# Patient Record
Sex: Female | Born: 1970 | Race: Asian | Hispanic: No | Marital: Single | Smoking: Never smoker
Health system: Southern US, Community
[De-identification: ages and names within clinical notes are randomized; demographics above are authoritative.]

## PROBLEM LIST (undated history)

## (undated) DIAGNOSIS — K219 Gastro-esophageal reflux disease without esophagitis: Secondary | ICD-10-CM

## (undated) DIAGNOSIS — R0989 Other specified symptoms and signs involving the circulatory and respiratory systems: Secondary | ICD-10-CM

## (undated) DIAGNOSIS — E049 Nontoxic goiter, unspecified: Secondary | ICD-10-CM

## (undated) DIAGNOSIS — G43909 Migraine, unspecified, not intractable, without status migrainosus: Secondary | ICD-10-CM

## (undated) DIAGNOSIS — E079 Disorder of thyroid, unspecified: Secondary | ICD-10-CM

## (undated) DIAGNOSIS — E039 Hypothyroidism, unspecified: Secondary | ICD-10-CM

## (undated) DIAGNOSIS — B019 Varicella without complication: Secondary | ICD-10-CM

## (undated) DIAGNOSIS — I8390 Asymptomatic varicose veins of unspecified lower extremity: Secondary | ICD-10-CM

## (undated) DIAGNOSIS — D649 Anemia, unspecified: Secondary | ICD-10-CM

## (undated) DIAGNOSIS — N6019 Diffuse cystic mastopathy of unspecified breast: Secondary | ICD-10-CM

## (undated) HISTORY — DX: Asymptomatic varicose veins of unspecified lower extremity: I83.90

## (undated) HISTORY — DX: Varicella without complication: B01.9

## (undated) HISTORY — DX: Disorder of thyroid, unspecified: E07.9

## (undated) HISTORY — DX: Other specified symptoms and signs involving the circulatory and respiratory systems: R09.89

## (undated) HISTORY — DX: Migraine, unspecified, not intractable, without status migrainosus: G43.909

## (undated) HISTORY — DX: Nontoxic goiter, unspecified: E04.9

## (undated) HISTORY — DX: Diffuse cystic mastopathy of unspecified breast: N60.19

---

## 1999-10-03 ENCOUNTER — Other Ambulatory Visit: Admission: RE | Admit: 1999-10-03 | Discharge: 1999-10-03 | Payer: Self-pay | Admitting: Obstetrics and Gynecology

## 2001-01-05 ENCOUNTER — Other Ambulatory Visit: Admission: RE | Admit: 2001-01-05 | Discharge: 2001-01-05 | Payer: Self-pay | Admitting: Obstetrics and Gynecology

## 2002-09-12 ENCOUNTER — Other Ambulatory Visit: Admission: RE | Admit: 2002-09-12 | Discharge: 2002-09-12 | Payer: Self-pay | Admitting: Obstetrics and Gynecology

## 2002-12-30 ENCOUNTER — Ambulatory Visit (HOSPITAL_COMMUNITY): Admission: RE | Admit: 2002-12-30 | Discharge: 2002-12-30 | Payer: Self-pay | Admitting: Family Medicine

## 2002-12-31 ENCOUNTER — Encounter (INDEPENDENT_AMBULATORY_CARE_PROVIDER_SITE_OTHER): Payer: Self-pay | Admitting: Specialist

## 2002-12-31 ENCOUNTER — Inpatient Hospital Stay (HOSPITAL_COMMUNITY): Admission: EM | Admit: 2002-12-31 | Discharge: 2003-01-01 | Payer: Self-pay | Admitting: Emergency Medicine

## 2003-01-01 HISTORY — PX: APPENDECTOMY: SHX54

## 2003-06-14 ENCOUNTER — Other Ambulatory Visit: Admission: RE | Admit: 2003-06-14 | Discharge: 2003-06-14 | Payer: Self-pay | Admitting: Obstetrics and Gynecology

## 2003-10-03 ENCOUNTER — Inpatient Hospital Stay (HOSPITAL_COMMUNITY): Admission: AD | Admit: 2003-10-03 | Discharge: 2003-10-03 | Payer: Self-pay | Admitting: Obstetrics and Gynecology

## 2003-12-04 ENCOUNTER — Ambulatory Visit: Payer: Self-pay | Admitting: Internal Medicine

## 2004-01-03 ENCOUNTER — Inpatient Hospital Stay (HOSPITAL_COMMUNITY): Admission: AD | Admit: 2004-01-03 | Discharge: 2004-01-03 | Payer: Self-pay | Admitting: Obstetrics and Gynecology

## 2004-01-04 ENCOUNTER — Encounter (INDEPENDENT_AMBULATORY_CARE_PROVIDER_SITE_OTHER): Payer: Self-pay | Admitting: Specialist

## 2004-01-04 ENCOUNTER — Inpatient Hospital Stay (HOSPITAL_COMMUNITY): Admission: AD | Admit: 2004-01-04 | Discharge: 2004-01-08 | Payer: Self-pay | Admitting: Obstetrics and Gynecology

## 2004-01-07 ENCOUNTER — Encounter: Payer: Self-pay | Admitting: Cardiology

## 2004-01-07 ENCOUNTER — Encounter: Payer: Self-pay | Admitting: Obstetrics and Gynecology

## 2004-01-09 ENCOUNTER — Inpatient Hospital Stay (HOSPITAL_COMMUNITY): Admission: AD | Admit: 2004-01-09 | Discharge: 2004-01-09 | Payer: Self-pay | Admitting: Obstetrics and Gynecology

## 2004-01-16 ENCOUNTER — Ambulatory Visit: Payer: Self-pay | Admitting: Internal Medicine

## 2004-02-29 ENCOUNTER — Ambulatory Visit: Payer: Self-pay | Admitting: Internal Medicine

## 2004-11-14 ENCOUNTER — Encounter: Admission: RE | Admit: 2004-11-14 | Discharge: 2004-11-14 | Payer: Self-pay | Admitting: Family Medicine

## 2005-03-09 ENCOUNTER — Encounter: Admission: RE | Admit: 2005-03-09 | Discharge: 2005-03-09 | Payer: Self-pay | Admitting: Endocrinology

## 2005-03-25 ENCOUNTER — Other Ambulatory Visit: Admission: RE | Admit: 2005-03-25 | Discharge: 2005-03-25 | Payer: Self-pay | Admitting: Obstetrics and Gynecology

## 2005-08-05 ENCOUNTER — Encounter: Admission: RE | Admit: 2005-08-05 | Discharge: 2005-08-05 | Payer: Self-pay | Admitting: Endocrinology

## 2005-11-03 ENCOUNTER — Encounter: Admission: RE | Admit: 2005-11-03 | Discharge: 2005-11-03 | Payer: Self-pay | Admitting: Otolaryngology

## 2006-09-01 ENCOUNTER — Encounter: Admission: RE | Admit: 2006-09-01 | Discharge: 2006-09-01 | Payer: Self-pay | Admitting: Endocrinology

## 2006-09-29 ENCOUNTER — Ambulatory Visit (HOSPITAL_COMMUNITY): Admission: RE | Admit: 2006-09-29 | Discharge: 2006-09-29 | Payer: Self-pay | Admitting: Gastroenterology

## 2006-09-29 ENCOUNTER — Encounter (INDEPENDENT_AMBULATORY_CARE_PROVIDER_SITE_OTHER): Payer: Self-pay | Admitting: Gastroenterology

## 2007-03-15 ENCOUNTER — Encounter: Admission: RE | Admit: 2007-03-15 | Discharge: 2007-03-15 | Payer: Self-pay | Admitting: Endocrinology

## 2007-10-26 ENCOUNTER — Encounter: Admission: RE | Admit: 2007-10-26 | Discharge: 2007-10-26 | Payer: Self-pay | Admitting: Endocrinology

## 2009-05-29 DIAGNOSIS — N6019 Diffuse cystic mastopathy of unspecified breast: Secondary | ICD-10-CM

## 2009-05-29 HISTORY — DX: Diffuse cystic mastopathy of unspecified breast: N60.19

## 2010-03-23 ENCOUNTER — Encounter: Payer: Self-pay | Admitting: Otolaryngology

## 2010-07-15 NOTE — Op Note (Signed)
NAME:  [name], [name]     ACCOUNT NO.:  [id number]   MEDICAL RECORD NO.:  [id number]          PATIENT TYPE:  AMB   LOCATION:  ENDO                         FACILITY:  [facility]   PHYSICIAN:  Anselmo Rod, M.D.  DATE OF BIRTH:  1970/03/13   DATE OF PROCEDURE:  09/29/2006  DATE OF DISCHARGE:                               OPERATIVE REPORT   PROCEDURE PERFORMED:  Esophagogastroduodenoscopy with multiple cold  biopsies.   ENDOSCOPIST:  Anselmo Rod, M.D.   INSTRUMENT USED:  Pentax video panendoscope.   INDICATIONS FOR PROCEDURE:  40 year old [place] female with a history  epigastric pain and reflux, rule out peptic ulcer disease, esophagitis,  gastritis, etc.   PREPROCEDURE PREPARATION:  Informed consent was procured from the  patient.  The patient fasted for 8 hours prior to the procedure.  The  risks and benefits of the procedure were discussed with the patient in  detail.   PREPROCEDURE PHYSICAL:  The patient had stable vital signs.  Neck  supple.  Lungs clear to auscultation.  S1 and S2 regular.  Abdomen soft  with normal bowel sounds.   DESCRIPTION OF PROCEDURE:  The patient was placed in the left lateral  decubitus position and sedated with 75 mcg of Fentanyl and 6 mg of  Versed given intravenously in slow incremental doses.  Once the patient  was adequately sedated and maintained on low flow oxygen and continuous  cardiac monitoring, the Pentax video panendoscope was advanced through  the mouthpiece over the tongue into esophagus under direct vision.  The  entire esophagus was widely patent, there was no evidence of ring,  stricture, masses, esophagitis or Barrett's mucosa.  The scope was then  advanced into the stomach.  Mild diffuse gastritis was noted.  No  erosions, ulcers, masses or polyps were identified.  Retroflexion in the  high cardia revealed no evidence of a hiatal hernia.  The proximal small  bowel appeared normal.  There was no obvious obstruction.   The patient  tolerated the procedure well without immediate complications.   IMPRESSION:  1. Normal EGD.  2. Healthy appearing GE junction.  3. Mild diffuse gastritis, biopsies done for H. pylori (cold biopsies      x4).  4. Normal proximal small bowel.   RECOMMENDATIONS:  1. Continue PPI.  2. Avoid nonsteroidals.  3. Treat with antibiotics if H. pylori is present on biopsies.  4. Outpatient follow up as need arises in the future.      Anselmo Rod, M.D.  Electronically Signed     JNM/MEDQ  D:  09/29/2006  T:  09/29/2006  Job:  [id number]   cc:   Charlesetta Shanks

## 2010-07-18 NOTE — Op Note (Signed)
NAMEALEXIE, [name] NO.:  [id number]   MEDICAL RECORD NO.:  [id number]          PATIENT TYPE:  INP   LOCATION:  9101                          FACILITY:  [facility]   PHYSICIAN:  Malachi Pro. Ambrose Mantle, M.D. DATE OF BIRTH:  12/09/1970   DATE OF PROCEDURE:  01/04/2004  DATE OF DISCHARGE:                                 OPERATIVE REPORT   PREOPERATIVE DIAGNOSES:  1.  Intrauterine pregnancy at 39 plus weeks.  2.  Failure to progress.  3.  Probable chorioamnionitis.   POSTOPERATIVE DIAGNOSES:  1.  Intrauterine pregnancy at 39 plus weeks.  2.  Failure to progress.  3.  Probable chorioamnionitis.   OPERATION:  Low transverse cervical cesarean section.   SURGEON:  Malachi Pro. Ambrose Mantle, M.D.   ANESTHESIA:  Epidural.   DESCRIPTION OF PROCEDURE:  The patient was brought to the operating room and  the epidural anesthetic was boosted, fetal heart tones were confirmed to  still have a tachycardia of 170 to 185. A Foley catheter was indwelling, the  abdomen was prepped with Betadine solution and draped as a sterile field,  anesthesia was confirmed by pinching the lower abdomen with an Allis clamp  and a low transverse incision was made suprapubically, carried in layers to  the skin, subcutaneous tissue and fascia. The fascia was separated from the  rectus muscle superiorly and inferiorly and the rectus muscle was split in  the midline. The peritoneum was opened vertically. The lower uterine segment  was exposed, an incision was made into the lower uterine segment, it was  extended transversely and then this was pulled away to pull the bladder  away. An incision was then made into the lower uterine segment initially  with a knife and then with my finger. Clear fluid was obtained. It had been  thought to be slightly meconium stained in the labor room initially but I  saw no evidence of meconium. The incision was enlarged by pulling superiorly  and inferiorly on the incision.  The  vertex was in a posterior position, it  was rotated anteriorly and then delivered easily through the incisional  opening, nose and pharynx was suctioned with the bulb. The remainder of the  baby was delivered, the cord was clamped and the infant was given to Angelita Ingles, M.D. who was in attendance.  He assigned the Apgar's for the baby  which will be recorded in the delivery noted but the baby breathed and cried  immediately on the abdominal wall of the mother.  The placenta was removed  after routine cord blood studies were obtained and a segment of cord was  obtained in case a pH was necessary.  The inside of the uterus was inspected  and found to be free of debris. The uterine incision was then closed in two  layers using a running locked suture of #0 Vicryl in the first layer,  nonlocking suture of the same material on the second layer. Liberal  irrigation confirmed hemostasis. The uterus, tubes and ovaries appeared  normal.  Hemostasis was adequate, the abdominal wall was closed in layers  using  interrupted #0 Vicryl in the rectus muscle including some of the  peritoneum with the rectus muscle.  Two running sutures of #0 Vicryl on the  fascia, a running 3-0 Vicryl on the subcu tissue and staples on the  skin. The patient seemed to tolerate the procedure well. Blood loss was  estimated at 100 mL.  Sponge and needle counts were correct and the patient  was returned to recovery in satisfactory condition. The patient had been  given Unasyn at approximately 5 p.m., it was given again after the cord was  clamped and will be given at a q.6 h. basis.      TFH/MEDQ  D:  01/04/2004  T:  01/06/2004  Job:  [id number]

## 2010-07-18 NOTE — Discharge Summary (Signed)
[name], [name] NO.:  [id number]   MEDICAL RECORD NO.:  [id number]          PATIENT TYPE:  OUT   LOCATION:  XRAY                         [facility]ACILITY:  [facility]   PHYSICIAN:  Malachi Pro. Ambrose Mantle, M.D. DATE OF BIRTH:  1970-04-06   DATE OF ADMISSION:  01/07/2004  DATE OF DISCHARGE:  01/07/2004                                 DISCHARGE SUMMARY   HISTORY OF PRESENT ILLNESS:  This is a 40 year old female, para 0, gravida  1, EDC November 8, with group and type O positive, negative antibody,  nonreactive serology, rubella immune, hepatitis B surface antigen negative,  HIV negative, GC/Chlamydia negative, triple screen increased Downs Syndrome  risk to 1:100, blood Glucola test 108, Group B strep negative.  The  patient's course was that she was admitted with possible labor.  She had  failure to progress beyond 5-6 cm and underwent a low transverse cervical C-  section on January 04, 2004, with delivery of a living female infant 7 pounds  14 ounces with Apgars of 8 at 1 and 9 at 5 minutes.  On postpartum day #1,  the patient was afebrile, doing well.  Her hemoglobin dropped from 12.6 to  9.0.  The patient was treated with Unasyn and her fever, which had risen to  102.9 degrees during labor, came down and never went back up.  On postop day  #3, at approximately 2 a.m., the patient awoke short of breath.  O2  saturation was 84-86.  She was placed on oxygen, and the patient rested  well.  AT approximately 3 a.m., she awoke with shortness of breath and  wanted a breathing treatment.  I was called, I saw the patient immediately,  and on 3 liters of O2, the patient's blood gases showed a PO2 of 74.  Chest  x-ray showed no overt heart failure, possible left pleural effusion.  Her  weight was 153 pounds which was higher than it had been in her last prenatal  visit.  She was slightly short of breath.  Lungs were clear except slight  rales at the right base.  She did have 3+ pretibial  edema.  My diagnosis was  acute respiratory embarrassment.  I gave Lasix and asked for pulmonary  consult.  He advised a spiral CT scan of the chest to rule out pulmonary  embolus.  It was done at [facility] and felt that she had  interstitial edema.  An echocardiogram was done to rule out cardiomyopathy.  The result was at the lower limit of normal with an ejection fraction of 50-  55%.  Did show mild to moderate mitral regurgitation.  Dr. Sherene Sires saw the  patient, and felt like she could be managed without a Cardiology consult.  On postop day #4, the patient felt better with Lasix.  She had diuresed 11-  1/2 pounds.  He felt that she could be discharged.  The patient's blood  pressures were in the 140-154/80-94 range.  She did have elevation of the  SGOT and PT.  A uric acid was 7.9, and LDH was 238.  I spoke to Dr.  Senaida Ores who felt like the patient could be discharged.  Her staples were  removed.  Strips were applied.  I gave her thorough instructions on looking  for signs and symptoms of preeclampsia including increasing headache,  epigastric pain, blood pressure above 150/100 and any weight gain of more  than 2 pounds in one day.  The patient was also advised to return to [facility] on  January 09, 2004 for weight check and repeat PIH panel.  Blood gases showed  a pH of 7.4, PCO2 34,  PO2 74, base deficit of 2.5.  Initial hemoglobin  12.6, hematocrit 39, white count 14,500, platelet count 242,000.  Follow up  hemoglobins were 9.0 and 9.0.  Platelets were 183 and 249.  There were 79  segs, 13 lymphs, 5 monos, 3 eosinophils.  Comprehensive metabolic panel  showed SGOT of 100 on November 7 and 103 on November 8.  SGPT of 59 on  November 7 and 97 on November 8.  Total bilirubin was 0.5 and 0.4.  A beta-  type natruretic peptide was pending.  TSH was pending.  Urinalysis done on  January 07, 2004, showed no protein.  RPR was nonreactive.  A portable chest  x-ray showed a low volume film  crowding the pulmonary vascularity.  Probable  left base atelectasis with probable left effusion.  The official results of  the CT scan and echocardiogram are not available, but the written reports  showed left ventricular function at the lower limit of normal.  Ejection  fraction estimated at 50-55%.  Mild to moderate mitral regurgitation.  CT  scan of the chest showed no evidence of acute pulmonary embolus,  interstitial edema and bilateral pleural effusions.   FINAL DIAGNOSES:  1.  Intrauterine pregnancy at 39+ weeks.  2.  Failure to progress in labor.  3.  Presumed relative cephalopelvic disproportion.  4.  Chorioamnionitis.  5.  Pulmonary edema.  6.  Ventricular function at lower limit of normal.   OPERATION:  Low transverse cesarean section.   FINAL CONDITION:  Improved.   DISCHARGE INSTRUCTIONS:  1.  Regular diet.  2.  Regular discharge instruction booklet.  3.  Watch for any signs of increasing headache, increased epigastric pain,      blood pressure above 150/100, weight gain of more than 2 pounds in 24      hours.   FOLLOWUP:  1.  The patient is to return to the maternal admission unit on January 09, 2004, for repeat [facility] panel and another weight.  2.  The patient is to return to see Dr. Sherene Sires in one week.   DISCHARGE MEDICATIONS:  1.  Percocet 5/325 24 tablets one q.4-6h. p.r.n. pain given at discharge.  2.  Diovan 160/25 one daily given at discharge.      TFH/MEDQ  D:  01/08/2004  T:  01/09/2004  Job:  [id number]   cc:   Charlaine Dalton. Sherene Sires, M.D. [facility]

## 2010-07-18 NOTE — Op Note (Signed)
NAMEROSILYN, COACHMAN NO.:  [id number]   MEDICAL RECORD NO.:  [id number]                   PATIENT TYPE:  OUT   LOCATION:  XRAY                                 FACILITY:  [facility]   PHYSICIAN:  Sharlet Salina T. Hoxworth, M.D.          DATE OF BIRTH:  Jan 11, 1971   DATE OF PROCEDURE:  12/31/2002  DATE OF DISCHARGE:                                 OPERATIVE REPORT   PREOPERATIVE DIAGNOSIS:  Acute appendicitis.   POSTOPERATIVE DIAGNOSIS:  Acute appendicitis.   SURGICAL PROCEDURE:  Laparoscopic appendectomy.   SURGEON:  Lorne Skeens. Hoxworth, M.D.   ANESTHESIA:  General.   BRIEF HISTORY:  [name] is a 40 year old female, who presents with  eight hours of periumbilical and right lower quadrant pain.  She has had a  CT scan suggesting acute appendicitis.  She has appropriate tenderness.  Laparoscopic appendectomy has been recommended and accepted.  The nature of  the procedure, indications, risks of bleeding, infection were discussed and  understood preoperatively.  She is now brought to the operating room for  this procedure.   DESCRIPTION OF OPERATION:  The patient brought to the operating room and  placed in supine position on the operating table, and general endotracheal  anesthesia was induced.  She had received broad-spectrum antibiotics.  The  abdomen was widely sterilely prepped and draped and Foley catheter inserted.  Local anesthesia was used to infiltrate the trocar sites.  An open Hasson  technique was used at the umbilicus through a mattress suture of 0 Vicryl.  Under direct vision, a 5 mm trocar was placed in the right upper quadrant  and a 12 mm trocar in the left lower quadrant.  The appendix was located in  the retrocecal position but free and was acutely inflamed without  perforation.  The appendix and cecum were mobilized somewhat, dividing the  lateral peritoneal attachments.  The mesentery of the appendix was then  sequentially  taken with the harmonic scalpel down to the base until the  appendix was completely freed.  The base was free of any inflammation.  It  was divided with the EndoGIA blue load 45 mm stapler, and the appendix was  removed through the Hasson trocar.  The staple line was intact; there was no  bleeding.  The right lower quadrant was irrigated until clear.  Trocar was  removed under direct vision and all CO2 evacuated from the peritoneal  cavity.  The mattress suture was secured at the umbilicus.  The skin  incisions were closed with interrupted subcuticular 4-0 Monocryl and Steri-  Strips.  Sponge, needle, and instrument counts were correct.  Dry sterile  dressings were applied and the patient taken to recovery in good condition.  Lorne Skeens. Hoxworth, M.D.    Tory Emerald  D:  12/31/2002  T:  12/31/2002  Job:  [id number]

## 2010-07-18 NOTE — H&P (Signed)
[name], CASS NO.:  [id number]   MEDICAL RECORD NO.:  [id number]          PATIENT TYPE:  INP   LOCATION:  9101                          FACILITY:  [facility]   PHYSICIAN:  Malachi Pro. Ambrose Mantle, M.D. DATE OF BIRTH:  [birth year]   DATE OF ADMISSION:  01/04/2004  DATE OF DISCHARGE:                                HISTORY & PHYSICAL   HISTORY:  This is a 40 year old, Asian, married female, para 0, gravida 1,  EDC January 08, 2004 by ultrasound admitted in probable labor.  Blood group  and type O positive, negative antibody, nonreactive serology, rubella  immune.  Hepatitis B surface antigen negative, HIV negative, GC and  chlamydia negative. Triple screen increased down syndrome risk to 1 in 100,  patient offered amnio.  One hour Glucola 108, group B strep negative.  Vaginal ultrasound May 31, 2003 crown rump length 1.77 cm, 8 weeks 2 days,  [facility] January 08, 2004. She was begun on levothyroxine for a low normal T4.  Repeat ultrasound August 09, 2003 mean gestational age [redacted] weeks 1 day, all  anatomy was normal. Repeat ultrasound on August 29, 2003 all anatomy normal.  On January 03, 2004, the cervix was closed, 50%, she was seen in the  maternity admission unit during the night of January 04, 2004 and the cervix  did not change in spite of contractions. She came back to maternity  admission in the morning of admission and the nurse felt the cervix was 3  cm, 90%.   PAST MEDICAL HISTORY:  No known allergies.  Illnesses:  Goiter and  migraines.   PAST SURGICAL HISTORY:  In 2004 an appendectomy.   ALCOHOL/TOBACCO/DRUGS:  None.   FAMILY HISTORY:  Maternal aunt with high blood pressure and uterine cancer.   PHYSICAL EXAMINATION:  VITAL SIGNS:  On admission, the patient's vital signs  were normal.  HEART:  Normal size and sounds, no murmurs.  LUNGS:  Clear to P&A.  ABDOMEN:  Fundal height had been 38 cm on January 02, 2004, fetal heart  tones were 140. No decelerations. By   my exam, the cervix was 1-2 cm, 60%  vertex and a -3.   The patient was admitted for augmentation. At 12:21 p.m., I was called to  see the patient because of fetal tachycardia, late decelerations and  increased vaginal bleeding. An epidural had been given at 10:50 a.m.,  decelerations began after that and worsened with insertion of a Foley  catheter with the patient on her back.  At 12:21 p.m., the fetal heart rate  was normal with no decelerations, temperature was 100.7 rectally, blood  pressure was 101/57 compared to 117/70 before the epidural.  The uterus was  soft between contractions, cervix was 3 plus cm, 90% vertex at a -2, 15 mL  of clots were removed from the cervix. There was no evidence of fetal  compromise, fetal  heart rate decelerations were thought to be secondary to  position, epidural and relative hypotension. The Pitocin had been stopped.  By 1:57 p.m., there had been another 15 mL clot, contractions were every 3  minutes,  cervix was unchanged, fetal heart rate was normal, Pitocin was  begun again.  The patient's temperature elevated to 102, Unasyn was begun,  contractions were every 3 minutes on 6 mU of Pitocin. The cervix was 4+ cm  at 5:35 p.m. and I made a decision to watch her for two more hours.  At  7:30, I checked her and she was 6 cm.  Temperature was 102.7.  At 8:30 p.m.,  the nurse felt she was 8 cm but now at 9:30 p.m. I have examined her and the  patient is no more than 5-6  cm. Vertex still at a -1 to -2.  Will proceed with C section for failure to  progress in labor, probable chorioamnionitis, occasional late decelerations  and persistent fetal tachycardia that is thought to be secondary to the  maternal temperature. The patient understands and agrees to proceed.      TFH/MEDQ  D:  01/04/2004  T:  01/06/2004  Job:  [id number]

## 2010-07-18 NOTE — H&P (Signed)
   [name], [name] NO.:  [id number]   MEDICAL RECORD NO.:  [id number]                   PATIENT TYPE:  OUT   LOCATION:  XRAY                                 [facility]AC[facility]   PHYSICIAN:  Sharlet Salina T. Hoxworth, M.D.          DATE OF BIRTH:  September 17, 1970   DATE OF ADMISSION:  12/30/2002  DATE OF DISCHARGE:                                HISTORY & PHYSICAL   CHIEF COMPLAINT:  Right lower quadrant pain.   HISTORY OF PRESENT ILLNESS:  Ms. [name] is a 40 year old female with the  gradual onset of 8 hours of periumbilical and now right lower quadrant pain  which is constant. She has had some slight nausea and no vomiting. No fever  or chills. No history of any similar symptoms. No GU symptoms.   PAST MEDICAL HISTORY:  She has been treated in the past for hyperthyroidism.  Has migraine headaches.   PAST SURGICAL HISTORY:  No surgery or hospitalizations or serious illnesses.   MEDICATIONS:  None.   ALLERGIES:  None.   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  Does not smoke cigarettes or drink alcohol.   FAMILY HISTORY:  Noncontributory.   REVIEW OF SYSTEMS:  GENERAL:  No fever or chills. RESPIRATORY:  No history  of asthma, shortness of breath or wheezing.  CARDIAC:  No palpitations, chest pain. GASTROINTESTINAL:  As above.  GENITOURINARY:  No urinary burning or frequency.   PHYSICAL EXAMINATION:  VITAL SIGNS:  She is afebrile and vital signs all  within normal limits.  GENERAL:  Petite female. No acute distress.  SKIN:  Warm and dry without rash or infection.  HEENT:  Sclera nonicteric. Nares and oropharynx clear.  NECK:  No masses.  LUNGS:  Clear to auscultation.  CARDIAC:  Regular rhythm without murmurs.  ABDOMEN:  Well localized right lower quadrant tenderness with some guarding  but no definite peritoneal signs. No masses.  EXTREMITIES:  No deformities. No swelling.  NEUROLOGIC:  Grossly normal.   LABORATORY DATA:  White count mildly elevated at 11.8 and hemoglobin normal  from Dr. Danella Deis at [facility].   CT scan of the abdomen and pelvis was obtained which shows appendix in a  retrocecal position with evidence of acute inflammation no evidence of  perforation.   ASSESSMENT/PLAN:  Acute appendicitis. The patient will receive preoperative  antibiotics and will be taken urgently to the operating room for  laparoscopic appendectomy.                                               Lorne Skeens. Hoxworth, M.D.    Tory Emerald  D:  12/31/2002  T:  12/31/2002  Job:  [id number]

## 2011-05-11 DIAGNOSIS — M79606 Pain in leg, unspecified: Secondary | ICD-10-CM

## 2011-05-11 DIAGNOSIS — E0590 Thyrotoxicosis, unspecified without thyrotoxic crisis or storm: Secondary | ICD-10-CM | POA: Insufficient documentation

## 2011-05-11 DIAGNOSIS — N6019 Diffuse cystic mastopathy of unspecified breast: Secondary | ICD-10-CM

## 2011-06-08 ENCOUNTER — Ambulatory Visit: Payer: Self-pay | Admitting: Obstetrics and Gynecology

## 2011-06-30 ENCOUNTER — Ambulatory Visit (INDEPENDENT_AMBULATORY_CARE_PROVIDER_SITE_OTHER): Payer: Commercial Managed Care - PPO | Admitting: Obstetrics and Gynecology

## 2011-06-30 ENCOUNTER — Encounter: Payer: Self-pay | Admitting: Obstetrics and Gynecology

## 2011-06-30 VITALS — BP 96/62 | Ht 58.5 in | Wt 110.0 lb

## 2011-06-30 DIAGNOSIS — N390 Urinary tract infection, site not specified: Secondary | ICD-10-CM

## 2011-06-30 DIAGNOSIS — R109 Unspecified abdominal pain: Secondary | ICD-10-CM | POA: Insufficient documentation

## 2011-06-30 DIAGNOSIS — Z124 Encounter for screening for malignant neoplasm of cervix: Secondary | ICD-10-CM

## 2011-06-30 DIAGNOSIS — D649 Anemia, unspecified: Secondary | ICD-10-CM

## 2011-06-30 DIAGNOSIS — E039 Hypothyroidism, unspecified: Secondary | ICD-10-CM | POA: Insufficient documentation

## 2011-06-30 LAB — POCT URINALYSIS DIPSTICK
Spec Grav, UA: 1.015
pH, UA: 5

## 2011-06-30 NOTE — Progress Notes (Signed)
The patient reports:normal menses, no abnormal bleeding, pelvic pain or discharge, no complaints  Contraception:no method  Last mammogram: not applicable Last pap: was normal and not applicable May  2010  GC/Chlamydia cultures offered: declined HIV/RPR/HbsAg offered:  declined HSV 1 and 2 glycoprotein offered: declined  Menstrual cycle regular and monthly: Yes Menstrual flow normal: Yes  Urinary symptoms: none Normal bowel movements: No: constipated Reports abuse at home: No:   Subjective:    [name] is a 41 y.o. female, No obstetric history on file., who presents for an annual exam. See above.she was told that her hemoglobin was 8.  She has a history of GI distress.  Prior Hysterectomy: No    History   Social History  . Marital Status: Married    Spouse Name: N/A    Number of Children: N/A  . Years of Education: N/A   Social History Main Topics  . Smoking status: Never Smoker   . Smokeless tobacco: Never Used  . Alcohol Use: No  . Drug Use: No  . Sexually Active: Yes   Other Topics Concern  . None   Social History Narrative  . None    Menstrual cycle:   LMP: Patient's last menstrual period was 06/13/2011.           Cycle: Regular, monthly with normal flow and no severe dysmenorrha  The following portions of the patient's history were reviewed and updated as appropriate: allergies, current medications, past family history, past medical history, past social history, past surgical history and problem list.  Review of Systems Pertinent items are noted in HPI. Breast:Negative for breast lump,nipple discharge or nipple retraction Gastrointestinal: Negative for abdominal pain, change in bowel habits or rectal bleeding Urinary:negative   Objective:    BP 96/62  Ht 4' 10.5" (1.486 m)  Wt 110 lb (49.896 kg)  BMI 22.60 kg/m2  LMP 06/13/2011    Weight:  Wt Readings from Last 1 Encounters:  06/30/11 110 lb (49.896 kg)          BMI: Body mass  index is 22.60 kg/(m^2).  General Appearance: Alert, appropriate appearance for age. No acute distress HEENT: Grossly normal Neck / Thyroid: Supple, no masses, nodes or enlargement Lungs: clear to auscultation bilaterally Back: No CVA tenderness Breast Exam: No masses or nodes.No dimpling, nipple retraction or discharge. Cardiovascular: Regular rate and rhythm. S1, S2, no murmur Gastrointestinal: Soft, non-tender, no masses or organomegaly  ++++++++++++++++++++++++++++++++++++++++++++++++++++++++  Pelvic Exam: External genitalia: normal general appearance Vaginal: normal mucosa without prolapse or lesions Cervix: normal appearance Adnexa: normal bimanual exam Uterus: normal size shape and consistency Rectovaginal: normal rectal, no masses  ++++++++++++++++++++++++++++++++++++++++++++++++++++++++  Lymphatic Exam: Non-palpable nodes in neck, clavicular, axillary, or inguinal regions Neurologic: Normal speech, no tremor  Psychiatric: Alert and oriented, appropriate affect.   Wet Prep:not applicable Urinalysis:not applicable UPT: Not done   Assessment:    Normal gyn exam   Severe anemia of uncertain etiology.  GI distress.  The patient may need an upper GI series  Overweight or obese: No   Pelvic relaxation: No   Plan:    mammogram pap smear Return to office in 2 weeks for ultrasound. Contraception:no method    STD screen request: none RPR: No.  HBsAg: No. Hepatitis C: No.  The updated Pap smear screening guidelines were discussed with the patient. The patient requested that I obtain a Pap smear: Yes.  Kegel exercises discussed: Yes.  Proper diet and regular exercise were reviewed.  Annual mammograms recommended  starting at age 45. Proper breast care was discussed.  Screening colonoscopy is recommended beginning at age 90.  Regular health maintenance was reviewed.  Sleep hygiene was discussed.  Adequate calcium and vitamin D intake was  emphasized.  Mylinda Latina.D.

## 2011-07-02 LAB — PAP IG W/ RFLX HPV ASCU

## 2011-07-10 ENCOUNTER — Encounter: Payer: Self-pay | Admitting: Obstetrics and Gynecology

## 2011-07-10 ENCOUNTER — Other Ambulatory Visit: Payer: Self-pay

## 2011-07-13 ENCOUNTER — Ambulatory Visit (INDEPENDENT_AMBULATORY_CARE_PROVIDER_SITE_OTHER): Payer: Commercial Managed Care - PPO | Admitting: Obstetrics and Gynecology

## 2011-07-13 ENCOUNTER — Ambulatory Visit (INDEPENDENT_AMBULATORY_CARE_PROVIDER_SITE_OTHER): Payer: Commercial Managed Care - PPO

## 2011-07-13 ENCOUNTER — Other Ambulatory Visit: Payer: Self-pay | Admitting: Obstetrics and Gynecology

## 2011-07-13 ENCOUNTER — Encounter: Payer: Self-pay | Admitting: Obstetrics and Gynecology

## 2011-07-13 ENCOUNTER — Other Ambulatory Visit: Payer: Self-pay

## 2011-07-13 DIAGNOSIS — D649 Anemia, unspecified: Secondary | ICD-10-CM

## 2011-07-13 DIAGNOSIS — N926 Irregular menstruation, unspecified: Secondary | ICD-10-CM

## 2011-07-13 NOTE — Progress Notes (Signed)
The patient presents with a history of anemia and abdominal pain.  Her last menstrual cycle was June 13, 2011.  She feels she is known to have her cycle any day now.  An ultrasound shows normal female organs but a thickened endometrial stripe.  For optimal evaluation we will plan to have the patient return in 2 weeks once she has had her period.  We will then perform the high dorsal sonogram and endometrial biopsy if appropriate.  Dr. Stefano Gaul

## 2011-09-01 ENCOUNTER — Telehealth: Payer: Self-pay | Admitting: Obstetrics and Gynecology

## 2011-09-01 NOTE — Telephone Encounter (Signed)
Please sch pt for sonohyst

## 2011-09-01 NOTE — Telephone Encounter (Signed)
TRIAGE/PROC, QUES

## 2013-06-27 ENCOUNTER — Other Ambulatory Visit: Payer: Self-pay

## 2013-06-27 DIAGNOSIS — Z1231 Encounter for screening mammogram for malignant neoplasm of breast: Secondary | ICD-10-CM

## 2013-07-10 ENCOUNTER — Encounter (INDEPENDENT_AMBULATORY_CARE_PROVIDER_SITE_OTHER): Payer: Self-pay

## 2013-07-10 ENCOUNTER — Ambulatory Visit
Admission: RE | Admit: 2013-07-10 | Discharge: 2013-07-10 | Disposition: A | Discharge status: Home / Self Care | Payer: Commercial Managed Care - PPO | Source: Ambulatory Visit

## 2013-07-10 DIAGNOSIS — Z1231 Encounter for screening mammogram for malignant neoplasm of breast: Secondary | ICD-10-CM

## 2013-12-01 ENCOUNTER — Other Ambulatory Visit: Payer: Self-pay | Admitting: Family Medicine

## 2013-12-01 ENCOUNTER — Other Ambulatory Visit (HOSPITAL_COMMUNITY)
Admission: RE | Admit: 2013-12-01 | Discharge: 2013-12-01 | Disposition: A | Discharge status: Home / Self Care | Payer: 59 | Source: Ambulatory Visit | Attending: Family Medicine | Admitting: Family Medicine

## 2013-12-01 DIAGNOSIS — Z124 Encounter for screening for malignant neoplasm of cervix: Secondary | ICD-10-CM | POA: Diagnosis present

## 2013-12-01 DIAGNOSIS — Z1151 Encounter for screening for human papillomavirus (HPV): Secondary | ICD-10-CM | POA: Diagnosis present

## 2013-12-06 LAB — CYTOLOGY - PAP

## 2013-12-28 ENCOUNTER — Other Ambulatory Visit: Payer: Self-pay | Admitting: Obstetrics and Gynecology

## 2014-01-01 ENCOUNTER — Encounter: Payer: Self-pay | Admitting: Obstetrics and Gynecology

## 2014-04-10 ENCOUNTER — Other Ambulatory Visit: Payer: Self-pay | Admitting: Obstetrics and Gynecology

## 2014-05-01 NOTE — H&P (Signed)
  Admission History and Physical Exam for a Gynecology Patient  [name] is a 44 y.o. female, G1P1001, who presents for hysteroscopy, resection of endometrial polyps, and a D&C. She has been followed at the [facility] and Gynecology division of [facility]. The patient has a history of menorrhagia.  An ultrasound showed endometrial polyps.  OB History    Gravida Para Term Preterm AB TAB SAB Ectopic Multiple Living   1 1 1       1       Past Medical History  Diagnosis Date  . Goiter   . Pulmonary congestion     postpartum  . Esophagitis   . Migraines   . Chicken pox   . Measles   . Varicose veins   . Thyroid disease   . Fibrocystic breast changes 05/29/2009    No prescriptions prior to admission    Past Surgical History  Procedure Laterality Date  . Appendectomy  01/01/2003  . Cesarean section  01/04/2004    No Known Allergies  Family History: family history includes Hypertension in her mother.  Social History:  reports that she has never smoked. She has never used smokeless tobacco. She reports that she does not drink alcohol or use illicit drugs.  Review of systems: See HPI.  Admission Physical Exam:    BMT = 23.8.  HEENT:                 Within normal limits Chest:                   Clear Heart:                    Regular rate and rhythm Breasts:                No masses, skin changes, bleeding, or discharge present Abdomen:             Nontender, no masses Extremities:          Grossly normal Neurologic exam: Grossly normal  Pelvic exam:  External genitalia: normal general appearance Vaginal: normal without tenderness, induration or masses Cervix: normal appearance Adnexa: normal size, nontender Uterus: normal size, nontender  Assessment:  Menorrhagia  Endometrial polyps  Plan:  The patient will undergo hysteroscopy, hysteroscopic resection of endometrial polyps, and a D&C.  She understands  the indications for surgical procedure.  She accepts the risk of, but not limited to, anesthetic complications, bleeding, infections, and possible damage to surrounding organs.   Eli Hose 05/01/2014

## 2014-05-02 ENCOUNTER — Encounter (HOSPITAL_COMMUNITY)
Admission: RE | Disposition: A | Discharge status: Home / Self Care | Payer: Self-pay | Source: Ambulatory Visit | Attending: Obstetrics and Gynecology

## 2014-05-02 ENCOUNTER — Encounter (HOSPITAL_COMMUNITY): Payer: Self-pay | Admitting: Certified Registered Nurse Anesthetist

## 2014-05-02 ENCOUNTER — Ambulatory Visit (HOSPITAL_COMMUNITY): Payer: Commercial Managed Care - PPO | Admitting: Certified Registered Nurse Anesthetist

## 2014-05-02 ENCOUNTER — Ambulatory Visit (HOSPITAL_COMMUNITY)
Admission: RE | Admit: 2014-05-02 | Discharge: 2014-05-02 | Disposition: A | Discharge status: Home / Self Care | Payer: Commercial Managed Care - PPO | Source: Ambulatory Visit | Attending: Obstetrics and Gynecology | Admitting: Obstetrics and Gynecology

## 2014-05-02 DIAGNOSIS — N920 Excessive and frequent menstruation with regular cycle: Secondary | ICD-10-CM | POA: Diagnosis present

## 2014-05-02 DIAGNOSIS — E049 Nontoxic goiter, unspecified: Secondary | ICD-10-CM | POA: Insufficient documentation

## 2014-05-02 DIAGNOSIS — N840 Polyp of corpus uteri: Secondary | ICD-10-CM | POA: Diagnosis present

## 2014-05-02 DIAGNOSIS — Z9089 Acquired absence of other organs: Secondary | ICD-10-CM | POA: Diagnosis not present

## 2014-05-02 DIAGNOSIS — D649 Anemia, unspecified: Secondary | ICD-10-CM | POA: Insufficient documentation

## 2014-05-02 DIAGNOSIS — E039 Hypothyroidism, unspecified: Secondary | ICD-10-CM | POA: Insufficient documentation

## 2014-05-02 DIAGNOSIS — Z862 Personal history of diseases of the blood and blood-forming organs and certain disorders involving the immune mechanism: Secondary | ICD-10-CM | POA: Insufficient documentation

## 2014-05-02 DIAGNOSIS — G43909 Migraine, unspecified, not intractable, without status migrainosus: Secondary | ICD-10-CM | POA: Diagnosis not present

## 2014-05-02 DIAGNOSIS — D259 Leiomyoma of uterus, unspecified: Secondary | ICD-10-CM | POA: Diagnosis not present

## 2014-05-02 HISTORY — PX: DILATATION & CURRETTAGE/HYSTEROSCOPY WITH RESECTOCOPE: SHX5572

## 2014-05-02 LAB — URINALYSIS, ROUTINE W REFLEX MICROSCOPIC
Bilirubin Urine: NEGATIVE
Glucose, UA: NEGATIVE mg/dL
KETONES UR: NEGATIVE mg/dL
NITRITE: NEGATIVE
Protein, ur: NEGATIVE mg/dL
Urobilinogen, UA: 0.2 mg/dL (ref 0.0–1.0)
pH: 5.5 (ref 5.0–8.0)

## 2014-05-02 LAB — CBC
HCT: 34.9 % — ABNORMAL LOW (ref 36.0–46.0)
Hemoglobin: 11.5 g/dL — ABNORMAL LOW (ref 12.0–15.0)
MCH: 21.7 pg — ABNORMAL LOW (ref 26.0–34.0)
MCHC: 33 g/dL (ref 30.0–36.0)
MCV: 66 fL — AB (ref 78.0–100.0)
Platelets: 149 10*3/uL — ABNORMAL LOW (ref 150–400)
RBC: 5.29 MIL/uL — ABNORMAL HIGH (ref 3.87–5.11)
RDW: 15.8 % — AB (ref 11.5–15.5)
WBC: 8.1 10*3/uL (ref 4.0–10.5)

## 2014-05-02 LAB — URINE MICROSCOPIC-ADD ON

## 2014-05-02 LAB — PREGNANCY, URINE: PREG TEST UR: NEGATIVE

## 2014-05-02 SURGERY — DILATATION & CURETTAGE/HYSTEROSCOPY WITH RESECTOCOPE
Anesthesia: General

## 2014-05-02 MED ORDER — ONDANSETRON HCL 4 MG/2ML IJ SOLN
INTRAMUSCULAR | Status: AC
  Filled 2014-05-02: qty 2

## 2014-05-02 MED ORDER — LACTATED RINGERS IV SOLN
INTRAVENOUS | Status: DC
  Administered 2014-05-02 (×2): via INTRAVENOUS

## 2014-05-02 MED ORDER — MEPERIDINE HCL 25 MG/ML IJ SOLN: 6.2500 mg | INTRAMUSCULAR | Status: DC | PRN

## 2014-05-02 MED ORDER — PROMETHAZINE HCL 12.5 MG PO TABS: 12.5000 mg | ORAL_TABLET | Freq: Four times a day (QID) | ORAL | Status: DC | PRN

## 2014-05-02 MED ORDER — HYDROCODONE-ACETAMINOPHEN 5-300 MG PO TABS: 1.0000 | ORAL_TABLET | ORAL | Status: DC | PRN

## 2014-05-02 MED ORDER — BUPIVACAINE-EPINEPHRINE 0.5% -1:200000 IJ SOLN
INTRAMUSCULAR | Status: DC | PRN
Start: 2014-05-02 — End: 2014-05-02
  Administered 2014-05-02: 10 mL

## 2014-05-02 MED ORDER — KETOROLAC TROMETHAMINE 30 MG/ML IJ SOLN: 30.0000 mg | Freq: Once | INTRAMUSCULAR | Status: DC | PRN

## 2014-05-02 MED ORDER — KETOROLAC TROMETHAMINE 30 MG/ML IJ SOLN
INTRAMUSCULAR | Status: AC
  Filled 2014-05-02: qty 1

## 2014-05-02 MED ORDER — DEXAMETHASONE SODIUM PHOSPHATE 4 MG/ML IJ SOLN
INTRAMUSCULAR | Status: AC
  Filled 2014-05-02: qty 1

## 2014-05-02 MED ORDER — ONDANSETRON HCL 4 MG/2ML IJ SOLN
INTRAMUSCULAR | Status: DC | PRN
  Administered 2014-05-02: 4 mg via INTRAVENOUS

## 2014-05-02 MED ORDER — KETOROLAC TROMETHAMINE 30 MG/ML IJ SOLN
INTRAMUSCULAR | Status: DC | PRN
  Administered 2014-05-02: 30 mg via INTRAMUSCULAR
  Administered 2014-05-02: 30 mg via INTRAVENOUS

## 2014-05-02 MED ORDER — SCOPOLAMINE 1 MG/3DAYS TD PT72
1.0000 | MEDICATED_PATCH | Freq: Once | TRANSDERMAL | Status: DC
  Administered 2014-05-02: 1.5 mg via TRANSDERMAL

## 2014-05-02 MED ORDER — ACETAMINOPHEN 325 MG PO TABS: 325.0000 mg | ORAL_TABLET | ORAL | Status: DC | PRN

## 2014-05-02 MED ORDER — MIDAZOLAM HCL 2 MG/2ML IJ SOLN
INTRAMUSCULAR | Status: AC
  Filled 2014-05-02: qty 2

## 2014-05-02 MED ORDER — IBUPROFEN 800 MG PO TABS: 800.0000 mg | ORAL_TABLET | Freq: Three times a day (TID) | ORAL | Status: DC | PRN

## 2014-05-02 MED ORDER — FENTANYL CITRATE 0.05 MG/ML IJ SOLN
INTRAMUSCULAR | Status: AC
  Filled 2014-05-02: qty 2

## 2014-05-02 MED ORDER — DEXAMETHASONE SODIUM PHOSPHATE 10 MG/ML IJ SOLN
INTRAMUSCULAR | Status: DC | PRN
  Administered 2014-05-02: 4 mg via INTRAVENOUS

## 2014-05-02 MED ORDER — LIDOCAINE HCL (CARDIAC) 20 MG/ML IV SOLN
INTRAVENOUS | Status: AC
  Filled 2014-05-02: qty 5

## 2014-05-02 MED ORDER — BUPIVACAINE-EPINEPHRINE (PF) 0.5% -1:200000 IJ SOLN
INTRAMUSCULAR | Status: AC
  Filled 2014-05-02: qty 30

## 2014-05-02 MED ORDER — PROPOFOL 10 MG/ML IV BOLUS
INTRAVENOUS | Status: DC | PRN
  Administered 2014-05-02: 150 mg via INTRAVENOUS

## 2014-05-02 MED ORDER — PROMETHAZINE HCL 25 MG/ML IJ SOLN: 6.2500 mg | INTRAMUSCULAR | Status: DC | PRN

## 2014-05-02 MED ORDER — PROPOFOL 10 MG/ML IV BOLUS
INTRAVENOUS | Status: AC
  Filled 2014-05-02: qty 20

## 2014-05-02 MED ORDER — ACETAMINOPHEN 160 MG/5ML PO SOLN: 325.0000 mg | ORAL | Status: DC | PRN

## 2014-05-02 MED ORDER — FENTANYL CITRATE 0.05 MG/ML IJ SOLN: 25.0000 ug | INTRAMUSCULAR | Status: DC | PRN

## 2014-05-02 MED ORDER — LIDOCAINE HCL (CARDIAC) 20 MG/ML IV SOLN
INTRAVENOUS | Status: DC | PRN
  Administered 2014-05-02: 60 mg via INTRAVENOUS

## 2014-05-02 MED ORDER — SCOPOLAMINE 1 MG/3DAYS TD PT72
MEDICATED_PATCH | TRANSDERMAL | Status: AC
  Administered 2014-05-02: 1.5 mg via TRANSDERMAL
  Filled 2014-05-02: qty 1

## 2014-05-02 MED ORDER — FENTANYL CITRATE 0.05 MG/ML IJ SOLN
INTRAMUSCULAR | Status: DC | PRN
Start: 2014-05-02 — End: 2014-05-02
  Administered 2014-05-02 (×3): 25 ug via INTRAVENOUS

## 2014-05-02 MED ORDER — MIDAZOLAM HCL 2 MG/2ML IJ SOLN
INTRAMUSCULAR | Status: DC | PRN
  Administered 2014-05-02: 2 mg via INTRAVENOUS

## 2014-05-02 MED ORDER — MIDAZOLAM HCL 2 MG/2ML IJ SOLN: 0.5000 mg | Freq: Once | INTRAMUSCULAR | Status: DC | PRN

## 2014-05-02 SURGICAL SUPPLY — 19 items
CANISTER SUCT 3000ML (MISCELLANEOUS) ×3 IMPLANT
CATH ROBINSON RED A/P 16FR (CATHETERS) ×3 IMPLANT
CLOTH BEACON ORANGE TIMEOUT ST (SAFETY) ×3 IMPLANT
CONTAINER PREFILL 10% NBF 60ML (FORM) ×6 IMPLANT
ELECT REM PT RETURN 9FT ADLT (ELECTROSURGICAL) ×3
ELECTRODE REM PT RTRN 9FT ADLT (ELECTROSURGICAL) ×2 IMPLANT
GLOVE BIO SURGEON STRL SZ 6.5 (GLOVE) ×6 IMPLANT
GLOVE BIO SURGEON STRL SZ7 (GLOVE) ×3 IMPLANT
GLOVE BIOGEL PI IND STRL 8.5 (GLOVE) ×2 IMPLANT
GLOVE BIOGEL PI INDICATOR 8.5 (GLOVE) ×1
GLOVE ECLIPSE 8.0 STRL XLNG CF (GLOVE) ×6 IMPLANT
GOWN STRL REUS W/TWL LRG LVL3 (GOWN DISPOSABLE) ×6 IMPLANT
LOOP ANGLED CUTTING 22FR (CUTTING LOOP) ×3 IMPLANT
PACK VAGINAL MINOR WOMEN LF (CUSTOM PROCEDURE TRAY) ×3 IMPLANT
PAD OB MATERNITY 4.3X12.25 (PERSONAL CARE ITEMS) ×3 IMPLANT
TOWEL OR 17X24 6PK STRL BLUE (TOWEL DISPOSABLE) ×6 IMPLANT
TUBING AQUILEX INFLOW (TUBING) ×3 IMPLANT
TUBING AQUILEX OUTFLOW (TUBING) ×3 IMPLANT
WATER STERILE IRR 1000ML POUR (IV SOLUTION) ×3 IMPLANT

## 2014-05-02 NOTE — Anesthesia Procedure Notes (Signed)
Procedure Name: LMA Insertion Date/Time: 05/02/2014 8:30 AM Performed by: Raenette Rover Pre-anesthesia Checklist: Patient identified, Patient being monitored, Emergency Drugs available and Suction available Patient Re-evaluated:Patient Re-evaluated prior to inductionOxygen Delivery Method: Circle system utilized Preoxygenation: Pre-oxygenation with 100% oxygen Intubation Type: IV induction Ventilation: Mask ventilation without difficulty LMA: LMA inserted LMA Size: 4.0 Number of attempts: 1 Placement Confirmation: positive ETCO2,  CO2 detector and breath sounds checked- equal and bilateral Tube secured with: Tape Dental Injury: Teeth and Oropharynx as per pre-operative assessment

## 2014-05-02 NOTE — Anesthesia Postprocedure Evaluation (Signed)
  Anesthesia Post Note  Patient: [name]  Procedure(s) Performed: Procedure(s): DILATATION & CURETTAGE/HYSTEROSCOPY WITH RESECTOCOPE  Anesthesia type: GA  Patient location: PACU  Post pain: Pain level controlled  Post assessment: Post-op Vital signs reviewed  Last Vitals:  Filed Vitals:   05/02/14 1015  BP: 109/74  Pulse: 69  Temp:   Resp: 17    Post vital signs: Reviewed  Level of consciousness: sedated  Complications: No apparent anesthesia complications

## 2014-05-02 NOTE — H&P (Signed)
The patient was interviewed and examined today.  The previously documented history and physical examination was reviewed. There are no changes. The operative procedure was reviewed. The risks and benefits were outlined again. The specific risks include, but are not limited to, anesthetic complications, bleeding, infections, and possible damage to the surrounding organs. The patient's questions were answered.  We are ready to proceed as outlined. The likelihood of the patient achieving the goals of this procedure is very likely.   BP 112/73 mmHg  Pulse 95  Temp(Src) 98.1 F (36.7 C) (Oral)  Resp 20  Ht 5' (1.524 m)  Wt 110 lb (49.896 kg)  BMI 21.48 kg/m2  SpO2 100%  Results for orders placed or performed during the hospital encounter of 05/02/14 (from the past 24 hour(s))  Urinalysis, Routine w reflex microscopic     Status: Abnormal   Collection Time: 05/02/14  7:14 AM  Result Value Ref Range   Color, Urine YELLOW YELLOW   APPearance CLEAR CLEAR   Specific Gravity, Urine >1.030 (H) 1.005 - 1.030   pH 5.5 5.0 - 8.0   Glucose, UA NEGATIVE NEGATIVE mg/dL   Hgb urine dipstick MODERATE (A) NEGATIVE   Bilirubin Urine NEGATIVE NEGATIVE   Ketones, ur NEGATIVE NEGATIVE mg/dL   Protein, ur NEGATIVE NEGATIVE mg/dL   Urobilinogen, UA 0.2 0.0 - 1.0 mg/dL   Nitrite NEGATIVE NEGATIVE   Leukocytes, UA LARGE (A) NEGATIVE  Pregnancy, urine     Status: None   Collection Time: 05/02/14  7:14 AM  Result Value Ref Range   Preg Test, Ur NEGATIVE NEGATIVE  Urine microscopic-add on     Status: None   Collection Time: 05/02/14  7:14 AM  Result Value Ref Range   Squamous Epithelial / LPF RARE RARE   WBC, UA 21-50 <3 WBC/hpf   Bacteria, UA RARE RARE   Gildardo Cranker, M.D.

## 2014-05-02 NOTE — Op Note (Signed)
OPERATIVE NOTE  [name]  DOB:    [birth year]  MRN:    [id number]  CSN:    [id number]  Date of Surgery:  05/02/2014  Preoperative Diagnosis:  Menorrhagia Uterine polyps Anemia (hemoglobin 11.5)  Postoperative Diagnosis:  Menorrhagia Uterine polyps Anemia (hemoglobin 11.5)  Procedure:  Hysteroscopy with resection of endometrial polyps Dilatation and curettage  Surgeon:  Gildardo Cranker, M.D.  Assistant:  None  Anesthetic:  General  Disposition:  The patient is a 44 y.o.-year-old female who presents with menorrhagia, uterine polyps, and anemia. She understands the indications for her surgical procedure. She accepts the risk of, but not limited to, anesthetic complications, bleeding, infections, and possible damage to the surrounding organs.  Findings:  On examination under anesthesia the uterus was upper limits normal size. No adnexal masses were appreciated. No parametrial disease was appreciated. The uterus sounded to 8 cm. The patient was noted to have multiple polyps measuring less than 0.5 cm within the endometrial cavity. No other pathology was appreciated. Pictures were taken with the patient's permission.  Procedure:  The patient was taken to the operating room where a general anesthetic was given. The perineum and vagina were prepped with Betadine. The bladder was drained of urine. The patient was sterilely draped. Examination under anesthesia was performed. A paracervical block was placed using 10 cc of half percent Marcaine with epinephrine. An endocervical curettage was performed. The cervix was gently dilated. The diagnostic hysteroscope was inserted and the cavity was carefully inspected. Pictures were taken. Findings included: Multiple endometrial polyps measuring less than 0.5 cm in size. The diagnostic hysteroscope was removed. The cervix was dilated further. The operative hysteroscope was inserted. The polyps were resected using a  single loop. The cavity was then curetted using a sharp curet. The cavity was felt to be clean at the end of our procedure. Hemostasis was adequate. All instruments were removed. The examination was repeated and the uterus was noted to be firm. Sponge, and needle counts were correct. The estimated blood loss for the procedure was 5 cc's. The estimated fluid deficit loss 200 cc's but fluid was noted on the operating room floor. The patient was awakened from her anesthetic without difficulty. She was returned to the supine position and and transported to the recovery room in stable condition. The endocervical curettings, endometrial resections, and endometrial curettings were sent to pathology.  Followup instructions:  The patient will return to see Dr. Raphael Gibney in 2 weeks. She was given a copy of the postoperative instructions for patients who've undergone hysteroscopy.  Discharge medications:  Motrin 800 mg every 8 hours as needed for mild to moderate pain. Vicodin one tablet every 4 hours as needed for severe pain. Phenergan 12.5 mg every 6 hours as needed for nausea.  Gildardo Cranker, M.D.

## 2014-05-02 NOTE — Anesthesia Preprocedure Evaluation (Addendum)
Anesthesia Evaluation  Patient identified by MRN, date of birth, ID band Patient awake    Reviewed: Allergy & Precautions, H&P , Patient's Chart, lab work & pertinent test results, reviewed documented beta blocker date and time   History of Anesthesia Complications Negative for: history of anesthetic complications  Airway Mallampati: II  TM Distance: >3 FB Neck ROM: full    Dental   Pulmonary  breath sounds clear to auscultation        Cardiovascular Exercise Tolerance: Good Rhythm:regular Rate:Normal     Neuro/Psych  Headaches, negative psych ROS   GI/Hepatic   Endo/Other    Renal/GU      Musculoskeletal   Abdominal   Peds  Hematology  (+) anemia ,   Anesthesia Other Findings   Reproductive/Obstetrics                            Anesthesia Physical Anesthesia Plan  ASA: I  Anesthesia Plan: General LMA   Post-op Pain Management:    Induction:   Airway Management Planned:   Additional Equipment:   Intra-op Plan:   Post-operative Plan:   Informed Consent: I have reviewed the patients History and Physical, chart, labs and discussed the procedure including the risks, benefits and alternatives for the proposed anesthesia with the patient or authorized representative who has indicated his/her understanding and acceptance.   Dental Advisory Given  Plan Discussed with: CRNA, Surgeon and Anesthesiologist  Anesthesia Plan Comments:        Anesthesia Quick Evaluation

## 2014-05-02 NOTE — Discharge Instructions (Signed)
Hysteroscopy, Care After °Refer to this sheet in the next few weeks. These instructions provide you with information on caring for yourself after your procedure. Your health care provider may also give you more specific instructions. Your treatment has been planned according to current medical practices, but problems sometimes occur. Call your health care provider if you have any problems or questions after your procedure.  °WHAT TO EXPECT AFTER THE PROCEDURE °After your procedure, it is typical to have the following: °· You may have some cramping. This normally lasts for a couple days. °· You may have bleeding. This can vary from light spotting for a few days to menstrual-like bleeding for 3-7 days. °HOME CARE INSTRUCTIONS °· Rest for the first 1-2 days after the procedure. °· Only take over-the-counter or prescription medicines as directed by your health care provider. Do not take aspirin. It can increase the chances of bleeding. °· Take showers instead of baths for 2 weeks or as directed by your health care provider. °· Do not drive for 24 hours or as directed. °· Do not drink alcohol while taking pain medicine. °· Do not use tampons, douche, or have sexual intercourse for 2 weeks or until your health care provider says it is okay. °· Take your temperature twice a day for 4-5 days. Write it down each time. °· Follow your health care provider's advice about diet, exercise, and lifting. °· If you develop constipation, you may: °¨ Take a mild laxative if your health care provider approves. °¨ Add bran foods to your diet. °¨ Drink enough fluids to keep your urine clear or pale yellow. °· Try to have someone with you or available to you for the first 24-48 hours, especially if you were given a general anesthetic. °· Follow up with your health care provider as directed. °SEEK MEDICAL CARE IF: °· You feel dizzy or lightheaded. °· You feel sick to your stomach (nauseous). °· You have abnormal vaginal discharge. °· You  have a rash. °· You have pain that is not controlled with medicine. °SEEK IMMEDIATE MEDICAL CARE IF: °· You have bleeding that is heavier than a normal menstrual period. °· You have a fever. °· You have increasing cramps or pain, not controlled with medicine. °· You have new belly (abdominal) pain. °· You pass out. °· You have pain in the tops of your shoulders (shoulder strap areas). °· You have shortness of breath. °Document Released: 12/07/2012 Document Reviewed: 12/07/2012 °[employer]® Patient Information ©2015 [employer]. This information is not intended to replace advice given to you by your health care provider. Make sure you discuss any questions you have with your health care provider. ° °

## 2014-05-02 NOTE — Transfer of Care (Signed)
Immediate Anesthesia Transfer of Care Note  Patient: [name]  Procedure(s) Performed: Procedure(s): DILATATION & CURETTAGE/HYSTEROSCOPY WITH RESECTOCOPE  Patient Location: PACU  Anesthesia Type:General  Level of Consciousness: awake, alert , oriented and patient cooperative  Airway & Oxygen Therapy: Patient Spontanous Breathing and Patient connected to nasal cannula oxygen  Post-op Assessment: Report given to RN and Post -op Vital signs reviewed and stable  Post vital signs: Reviewed and stable  Last Vitals:  Filed Vitals:   05/02/14 0726  BP: 112/73  Pulse: 95  Temp: 36.7 C  Resp: 20    Complications: No apparent anesthesia complications

## 2014-05-03 ENCOUNTER — Encounter (HOSPITAL_COMMUNITY): Payer: Self-pay | Admitting: Obstetrics and Gynecology

## 2014-06-21 ENCOUNTER — Other Ambulatory Visit: Payer: Self-pay

## 2014-06-21 DIAGNOSIS — Z1231 Encounter for screening mammogram for malignant neoplasm of breast: Secondary | ICD-10-CM

## 2014-07-04 ENCOUNTER — Encounter: Payer: Self-pay | Admitting: Neurology

## 2014-07-04 ENCOUNTER — Ambulatory Visit (INDEPENDENT_AMBULATORY_CARE_PROVIDER_SITE_OTHER): Payer: Commercial Managed Care - PPO | Admitting: Neurology

## 2014-07-04 ENCOUNTER — Ambulatory Visit (INDEPENDENT_AMBULATORY_CARE_PROVIDER_SITE_OTHER): Payer: Self-pay | Admitting: Neurology

## 2014-07-04 DIAGNOSIS — R202 Paresthesia of skin: Secondary | ICD-10-CM

## 2014-07-04 NOTE — Procedures (Signed)
     HISTORY:  [name] is a 44 year old patient with a three-month history of numbness in the left hand that occurs upon awakening at night. The patient has almost daily episodes. She denies any neck or shoulder discomfort. There is some discomfort and pain in the left hand. The patient is being evaluated for possible neuropathy or a cervical radiculopathy.  NERVE CONDUCTION STUDIES:  Nerve conduction studies were performed on the left upper extremity. The distal motor latencies and motor amplitudes for the median and ulnar nerves were within normal limits. The F wave latencies and nerve conduction velocities for these nerves were also normal. The sensory latencies for the median and ulnar nerves were normal.  The patient refused evaluation of the right upper extremity.  EMG STUDIES:  EMG study was performed on the left upper extremity:  The first dorsal interosseous muscle reveals 2 to 4 K units with full recruitment. No fibrillations or positive waves were noted. The abductor pollicis brevis muscle reveals 2 to 4 K units with full recruitment. No fibrillations or positive waves were noted. The extensor indicis proprius muscle reveals 1 to 3 K units with full recruitment. No fibrillations or positive waves were noted. The pronator teres muscle reveals 2 to 3 K units with full recruitment. No fibrillations or positive waves were noted. The biceps muscle reveals 1 to 2 K units with full recruitment. No fibrillations or positive waves were noted. The triceps muscle reveals 2 to 4 K units with full recruitment. No fibrillations or positive waves were noted. The anterior deltoid muscle reveals 2 to 3 K units with full recruitment. No fibrillations or positive waves were noted. The cervical paraspinal muscles were tested at 2 levels. No abnormalities of insertional activity were seen at either level tested. There was good relaxation.   IMPRESSION:  Nerve conduction studies done on  the left upper extremity were unremarkable. EMG evaluation of the left upper extremity is unremarkable. No evidence of a neuropathy or a left cervical radiculopathy is seen.  Jill Alexanders MD 07/04/2014 10:47 AM  [facility] [address] [place], [place] [address]  Phone [phone] Fax [phone]

## 2014-07-04 NOTE — Progress Notes (Signed)
Please refer to EMG and nerve conduction study procedure note. 

## 2014-07-12 ENCOUNTER — Encounter (INDEPENDENT_AMBULATORY_CARE_PROVIDER_SITE_OTHER): Payer: Self-pay

## 2014-07-12 ENCOUNTER — Ambulatory Visit
Admission: RE | Admit: 2014-07-12 | Discharge: 2014-07-12 | Disposition: A | Discharge status: Home / Self Care | Payer: 59 | Source: Ambulatory Visit

## 2014-07-12 DIAGNOSIS — Z1231 Encounter for screening mammogram for malignant neoplasm of breast: Secondary | ICD-10-CM

## 2014-09-10 ENCOUNTER — Encounter: Payer: Self-pay | Admitting: Neurology

## 2014-09-10 ENCOUNTER — Other Ambulatory Visit: Payer: 59

## 2014-09-10 ENCOUNTER — Ambulatory Visit (INDEPENDENT_AMBULATORY_CARE_PROVIDER_SITE_OTHER): Payer: 59 | Admitting: Neurology

## 2014-09-10 VITALS — BP 100/70 | HR 90 | Ht 58.5 in | Wt 113.6 lb

## 2014-09-10 DIAGNOSIS — R202 Paresthesia of skin: Secondary | ICD-10-CM

## 2014-09-10 LAB — VITAMIN B12: Vitamin B-12: 525 pg/mL (ref 211–911)

## 2014-09-10 NOTE — Patient Instructions (Addendum)
1.  Check blood work 2.  Please start using wrist splint at bedtime to avoid over flexion at the wrist 3.  Return to clinic in 3 months, or sooner as needed

## 2014-09-10 NOTE — Progress Notes (Signed)
Note sent

## 2014-09-10 NOTE — Progress Notes (Signed)
[facility] Clinic Note - Initial Visit   Date: 09/10/2014   [name] MRN: [id number] DOB: 1971-01-11   Dear Dr. [name]:  Thank you for your kind referral of [name] for consultation of bilateral hand paresthesias. Although her history is well known to you, please allow [place] to reiterate it for the purpose of our medical record. The patient was accompanied to the clinic by self.   History of Present Illness: [name] is a 44 y.o. right-handed Hispanic female with migraine, hypothyroidism, GERD, and anemia presenting for evaluation of bilateral hand numbness/paresthesias.    Since March 2016, she has started to wake up with her hand numb and tingling sensation which is worse on the left. It is restricted to the front and back of the hands and does not involve the forearm.  Symptoms are present only at night time and she is able to shake her hands to wake them up. She tried using a hand splint, but seems to help, but she does not recall why she stopped using it.  She had EMG of the left arm performed at [facility] which returned normal.  She denies weakness or neck pain.   Over the past several months, there has been no improvement in symptoms.   She initially thought symptoms were due to topamax so requested to be switched to something else.  Despite being switched to atenolol for her migraines, there is no marked change in her symptoms.    Out-side paper records, electronic medical record, and images have been reviewed where available and summarized as:  EMG left upper extremity (Dr. Jannifer Franklin) 07/04/2014:   Nerve conduction studies done on the left upper extremity were unremarkable. EMG evaluation of the left upper extremity is unremarkable. No evidence of a neuropathy or a left cervical radiculopathy is seen.   Past Medical History  Diagnosis Date  . Goiter   . Pulmonary congestion     postpartum  . Esophagitis   . Migraines   .  Chicken pox   . Measles   . Varicose veins   . Thyroid disease   . Fibrocystic breast changes 05/29/2009    Past Surgical History  Procedure Laterality Date  . Appendectomy  01/01/2003  . Cesarean section  01/04/2004  . Dilatation & currettage/hysteroscopy with resectocope  05/02/2014    Procedure: [place];  Surgeon: [name], MD;  Location: [facility];  Service: Gynecology;;     Medications:  Current Outpatient Prescriptions on File Prior to Visit  Medication Sig Dispense Refill  . aspirin-acetaminophen-caffeine (EXCEDRIN MIGRAINE) 250-250-65 MG per tablet Take 2 tablets by mouth every 6 (six) hours as needed for headache or migraine.    Marland Kitchen eletriptan (RELPAX) 40 MG tablet Take 40 mg by mouth as needed for migraine or headache. One tablet by mouth at onset of headache. May repeat in 2 hours if headache persists or recurs.    Marland Kitchen esomeprazole (NEXIUM) 40 MG capsule Take 40 mg by mouth daily before breakfast.    . ibuprofen (ADVIL,MOTRIN) 800 MG tablet Take 1 tablet (800 mg total) by mouth every 8 (eight) hours as needed. 50 tablet 1  . levothyroxine (SYNTHROID, LEVOTHROID) 50 MCG tablet Take 50 mcg by mouth daily.     No current facility-administered medications on file prior to visit.    Allergies: No Known Allergies  Family History: Family History  Problem Relation Age of Onset  . Hypertension Mother     Living  .  Asthma Father     Deceased, 65  . Healthy Brother   . Healthy Sister   . Asthma Son     Social History: History   Social History  . Marital Status: Married    Spouse Name: N/A  . Number of Children: N/A  . Years of Education: N/A   Occupational History  . Not on file.   Social History Main Topics  . Smoking status: Never Smoker   . Smokeless tobacco: Never Used  . Alcohol Use: No  . Drug Use: No  . Sexual Activity: Yes   Other Topics Concern  . Not on file   Social History Narrative   Lives with  husband and son in two story home.  Has one child.     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     Education: bachelors       Review of Systems:  CONSTITUTIONAL: No fevers, chills, night sweats, or weight loss.   EYES: No visual changes or eye pain ENT: No hearing changes.  No history of nose bleeds.   RESPIRATORY: No cough, wheezing and shortness of breath.   CARDIOVASCULAR: Negative for chest pain, and palpitations.   GI: Negative for abdominal discomfort, blood in stools or black stools.  No recent change in bowel habits.   GU:  No history of incontinence.   MUSCLOSKELETAL: No history of joint pain or swelling.  No myalgias.   SKIN: Negative for lesions, rash, and itching.   HEMATOLOGY/ONCOLOGY: Negative for prolonged bleeding, bruising easily, and swollen nodes.  No history of cancer.   ENDOCRINE: Negative for cold or heat intolerance, polydipsia or goiter.   PSYCH:  No depression or anxiety symptoms.   NEURO: As Above.   Vital Signs:  BP 100/70 mmHg  Pulse 90  Ht 4' 10.5" (1.486 m)  Wt 113 lb 9 oz (51.512 kg)  BMI 23.33 kg/m2  SpO2 99%   General Medical Exam:   General:  Well appearing, comfortable.   Eyes/ENT: see cranial nerve examination.   Neck: No masses appreciated.  Full range of motion without tenderness.  No carotid bruits. Respiratory:  Clear to auscultation, good air entry bilaterally.   Cardiac:  Regular rate and rhythm, no murmur.   Extremities:  No deformities, edema, or skin discoloration.  Skin:  No rashes or lesions.  Neurological Exam: MENTAL STATUS including orientation to time, place, person, recent and remote memory, attention span and concentration, language, and fund of knowledge is normal.  Speech is not dysarthric.  CRANIAL NERVES: II:  No visual field defects.  Unremarkable fundi.   III-IV-VI: Pupils equal round and reactive to light.  Normal conjugate, extra-ocular eye movements in all directions of gaze.  No nystagmus.  No ptosis.     V:  Normal facial sensation.   VII:  Normal facial symmetry and movements.   VIII:  Normal hearing and vestibular function.   IX-X:  Normal palatal movement.   XI:  Normal shoulder shrug and head rotation.   XII:  Normal tongue strength and range of motion, no deviation or fasciculation.  MOTOR:  No atrophy, fasciculations or abnormal movements.  No pronator drift.  Tone is normal.    Right Upper Extremity:    Left Upper Extremity:    Deltoid  5/5   Deltoid  5/5   Biceps  5/5   Biceps  5/5   Triceps  5/5   Triceps  5/5   Wrist extensors  5/5   Wrist  extensors  5/5   Wrist flexors  5/5   Wrist flexors  5/5   Finger extensors  5/5   Finger extensors  5/5   Finger flexors  5/5   Finger flexors  5/5   Dorsal interossei  5/5   Dorsal interossei  5/5   Abductor pollicis  5/5   Abductor pollicis  5-/5   Tone (Ashworth scale)  0  Tone (Ashworth scale)  0   Right Lower Extremity:    Left Lower Extremity:    Hip flexors  5/5   Hip flexors  5/5   Hip extensors  5/5   Hip extensors  5/5   Knee flexors  5/5   Knee flexors  5/5   Knee extensors  5/5   Knee extensors  5/5   Dorsiflexors  5/5   Dorsiflexors  5/5   Plantarflexors  5/5   Plantarflexors  5/5   Toe extensors  5/5   Toe extensors  5/5   Toe flexors  5/5   Toe flexors  5/5   Tone (Ashworth scale)  0  Tone (Ashworth scale)  0   MSRs:  Right                                                                 Left brachioradialis 2+  brachioradialis 2+  biceps 2+  biceps 2+  triceps 2+  triceps 2+  patellar 2+  patellar 2+  ankle jerk 2+  ankle jerk 2+  Hoffman no  Hoffman no  plantar response down  plantar response down   SENSORY:  Normal and symmetric perception of light touch, pinprick, vibration, and proprioception.  Romberg's sign absent.   COORDINATION/GAIT: Normal finger-to- nose-finger and heel-to-shin.  Intact rapid alternating movements bilaterally.  Able to rise from a chair without using arms.  Gait narrow based and  stable. Tandem and stressed gait intact.    IMPRESSION: [name] is a 44 year-old female presenting for evaluation of bilateral hand paresthesias, worse on the left.  Her exam is notable for mild left abductor pollicis brevis weakness. Tinel's sign over the wrist is negative.  Although her NCS/EMG does not demonstrate findings suggestive of carpal tunnel syndrome, palmer sensory studies were not performed which is more specific for median nerve entrapment at the wrist.  Because there was improvement when wearing the wrist splint, I feel that she most likely has carpal tunnel syndrome.  It is possible that symptoms are too mild to be detected by EMG or testing was performed too early.    PLAN/RECOMMENDATIONS:  1.  Check vitamin B12 2.  Encouraged to use wrist splint, especially at night time and to avoid hyperflexion at the wrist 3.  Discussed medication for paresthesias, but symptoms too intermittent to warrant daily medication 4.  If no improvement, may consider repeat EMG and/or MRI cervical spine 5.  Return to clinic in 3 months.   The duration of this appointment visit was 40 minutes of face-to-face time with the patient.  Greater than 50% of this time was spent in counseling, explanation of diagnosis, planning of further management, and coordination of care.   Thank you for allowing me to participate in patient's care.  If I can answer any additional questions, I would be pleased  to do so.    Sincerely,    Ido Wollman K. Posey Pronto, DO

## 2014-12-17 ENCOUNTER — Ambulatory Visit: Payer: 59 | Admitting: Neurology

## 2014-12-21 ENCOUNTER — Other Ambulatory Visit: Payer: Self-pay | Admitting: Otolaryngology

## 2014-12-21 DIAGNOSIS — IMO0002 Reserved for concepts with insufficient information to code with codable children: Secondary | ICD-10-CM

## 2014-12-25 ENCOUNTER — Ambulatory Visit
Admission: RE | Admit: 2014-12-25 | Discharge: 2014-12-25 | Disposition: A | Discharge status: Home / Self Care | Payer: Commercial Managed Care - PPO | Source: Ambulatory Visit | Attending: Otolaryngology | Admitting: Otolaryngology

## 2014-12-25 DIAGNOSIS — IMO0002 Reserved for concepts with insufficient information to code with codable children: Secondary | ICD-10-CM

## 2014-12-25 MED ORDER — IOPAMIDOL (ISOVUE-300) INJECTION 61%
75.0000 mL | Freq: Once | INTRAVENOUS | Status: AC | PRN
  Administered 2014-12-25: 75 mL via INTRAVENOUS

## 2015-06-03 DIAGNOSIS — L309 Dermatitis, unspecified: Secondary | ICD-10-CM | POA: Diagnosis not present

## 2015-06-24 ENCOUNTER — Other Ambulatory Visit: Payer: Self-pay

## 2015-06-24 DIAGNOSIS — Z1231 Encounter for screening mammogram for malignant neoplasm of breast: Secondary | ICD-10-CM

## 2015-07-15 ENCOUNTER — Ambulatory Visit: Payer: Commercial Managed Care - PPO | Admitting: Neurology

## 2015-07-15 DIAGNOSIS — Z029 Encounter for administrative examinations, unspecified: Secondary | ICD-10-CM

## 2015-07-16 ENCOUNTER — Ambulatory Visit: Payer: Commercial Managed Care - PPO

## 2015-09-29 ENCOUNTER — Encounter (HOSPITAL_COMMUNITY): Payer: Self-pay | Admitting: Emergency Medicine

## 2015-09-29 DIAGNOSIS — Z7982 Long term (current) use of aspirin: Secondary | ICD-10-CM | POA: Diagnosis not present

## 2015-09-29 DIAGNOSIS — Z79899 Other long term (current) drug therapy: Secondary | ICD-10-CM | POA: Insufficient documentation

## 2015-09-29 DIAGNOSIS — R29810 Facial weakness: Secondary | ICD-10-CM | POA: Diagnosis present

## 2015-09-29 DIAGNOSIS — E039 Hypothyroidism, unspecified: Secondary | ICD-10-CM | POA: Diagnosis not present

## 2015-09-29 DIAGNOSIS — Z5181 Encounter for therapeutic drug level monitoring: Secondary | ICD-10-CM | POA: Diagnosis not present

## 2015-09-29 DIAGNOSIS — G510 Bell's palsy: Secondary | ICD-10-CM | POA: Diagnosis not present

## 2015-09-29 LAB — I-STAT TROPONIN, ED: TROPONIN I, POC: 0 ng/mL (ref 0.00–0.08)

## 2015-09-29 LAB — I-STAT CHEM 8, ED
BUN: 7 mg/dL (ref 6–20)
CALCIUM ION: 1.23 mmol/L (ref 1.13–1.30)
CHLORIDE: 105 mmol/L (ref 101–111)
Creatinine, Ser: 0.6 mg/dL (ref 0.44–1.00)
GLUCOSE: 137 mg/dL — AB (ref 65–99)
HCT: 39 % (ref 36.0–46.0)
HEMOGLOBIN: 13.3 g/dL (ref 12.0–15.0)
Potassium: 3.3 mmol/L — ABNORMAL LOW (ref 3.5–5.1)
Sodium: 142 mmol/L (ref 135–145)
TCO2: 25 mmol/L (ref 0–100)

## 2015-09-29 NOTE — ED Triage Notes (Signed)
Pt. reports progressing right facial droop onset 2pm this afternoon , speech clear , equal strong grips with arm drift , alert and oriented / respirations unlabored .

## 2015-09-29 NOTE — ED Notes (Signed)
Pt. Initially seen and examined by Dr. Dina Rich at triage .

## 2015-09-30 ENCOUNTER — Emergency Department (HOSPITAL_COMMUNITY): Payer: Commercial Managed Care - PPO

## 2015-09-30 ENCOUNTER — Emergency Department (HOSPITAL_COMMUNITY)
Admission: EM | Admit: 2015-09-30 | Discharge: 2015-09-30 | Disposition: A | Discharge status: Home / Self Care | Payer: Commercial Managed Care - PPO | Attending: Emergency Medicine | Admitting: Emergency Medicine

## 2015-09-30 DIAGNOSIS — R29810 Facial weakness: Secondary | ICD-10-CM | POA: Diagnosis not present

## 2015-09-30 DIAGNOSIS — G510 Bell's palsy: Secondary | ICD-10-CM

## 2015-09-30 DIAGNOSIS — G43909 Migraine, unspecified, not intractable, without status migrainosus: Secondary | ICD-10-CM | POA: Diagnosis not present

## 2015-09-30 LAB — COMPREHENSIVE METABOLIC PANEL
ALBUMIN: 3.9 g/dL (ref 3.5–5.0)
ALT: 17 U/L (ref 14–54)
ANION GAP: 7 (ref 5–15)
AST: 23 U/L (ref 15–41)
Alkaline Phosphatase: 46 U/L (ref 38–126)
BUN: 8 mg/dL (ref 6–20)
CHLORIDE: 108 mmol/L (ref 101–111)
CO2: 23 mmol/L (ref 22–32)
Calcium: 9.1 mg/dL (ref 8.9–10.3)
Creatinine, Ser: 0.64 mg/dL (ref 0.44–1.00)
GFR calc Af Amer: >60 mL/min (ref >60)
Glucose, Bld: 139 mg/dL — ABNORMAL HIGH (ref 65–99)
POTASSIUM: 3.3 mmol/L — AB (ref 3.5–5.1)
Sodium: 138 mmol/L (ref 135–145)
TOTAL PROTEIN: 7.4 g/dL (ref 6.5–8.1)

## 2015-09-30 LAB — URINALYSIS, ROUTINE W REFLEX MICROSCOPIC
Bilirubin Urine: NEGATIVE
Glucose, UA: NEGATIVE mg/dL
Hgb urine dipstick: NEGATIVE
Ketones, ur: NEGATIVE mg/dL
LEUKOCYTES UA: NEGATIVE
Nitrite: NEGATIVE
PROTEIN: NEGATIVE mg/dL
SPECIFIC GRAVITY, URINE: 1.022 (ref 1.005–1.030)
pH: 5.5 (ref 5.0–8.0)

## 2015-09-30 LAB — DIFFERENTIAL
BASOS PCT: 0 %
Basophils Absolute: 0 10*3/uL (ref 0.0–0.1)
EOS ABS: 0.1 10*3/uL (ref 0.0–0.7)
EOS PCT: 1 %
LYMPHS ABS: 1.5 10*3/uL (ref 0.7–4.0)
Lymphocytes Relative: 21 %
MONOS PCT: 5 %
Monocytes Absolute: 0.4 10*3/uL (ref 0.1–1.0)
NEUTROS PCT: 73 %
Neutro Abs: 5.3 10*3/uL (ref 1.7–7.7)

## 2015-09-30 LAB — CBC
HCT: 34.4 % — ABNORMAL LOW (ref 36.0–46.0)
HEMOGLOBIN: 11.2 g/dL — AB (ref 12.0–15.0)
MCH: 20.7 pg — AB (ref 26.0–34.0)
MCHC: 32.6 g/dL (ref 30.0–36.0)
MCV: 63.6 fL — ABNORMAL LOW (ref 78.0–100.0)
Platelets: 257 10*3/uL (ref 150–400)
RBC: 5.41 MIL/uL — ABNORMAL HIGH (ref 3.87–5.11)
RDW: 17 % — AB (ref 11.5–15.5)
WBC: 7.2 10*3/uL (ref 4.0–10.5)

## 2015-09-30 LAB — PROTIME-INR
INR: 0.96
Prothrombin Time: 12.7 seconds (ref 11.4–15.2)

## 2015-09-30 LAB — APTT: APTT: 33 s (ref 24–36)

## 2015-09-30 LAB — POC URINE PREG, ED: PREG TEST UR: NEGATIVE

## 2015-09-30 MED ORDER — PREDNISONE 20 MG PO TABS: 60.0000 mg | ORAL_TABLET | Freq: Every day | ORAL | 0 refills | Status: DC

## 2015-09-30 MED ORDER — ARTIFICIAL TEARS OP OINT: TOPICAL_OINTMENT | OPHTHALMIC | 0 refills | Status: DC | PRN

## 2015-09-30 NOTE — ED Provider Notes (Signed)
[facility] DEPT Provider Note   CSN: [id number] Arrival date & time: 09/29/15  2315  First MD Initiated Contact with Patient 09/30/15 0211     By signing my name below, I, [name], attest that this documentation has been prepared under the direction and in the presence of [name], MD.  Electronically Signed: [name]. Sheppard Coil, ED Scribe. 09/30/15. 2:27 AM.   History   Chief Complaint Chief Complaint  Patient presents with  . Facial Droop    HPI HPI Comments: [name] is a 45 y.o. female with PMHx of Migraines who presents to the Emergency Department complaining of gradual worsening, constant, right-side facial droop onset 1400 on 09/29/15. Pt has associated "heaviness of the tongue". She reports having numbness of her face earlier but has now resolved. There are no modifying factors. Denies any pain. No recent illness. Denies any weakness, numbness, tingling of hands, speech difficulty, or visual disturbance. PCP is Dr. [name]. Pt takes Synthroid for hypothyroidism.Denies diplopia. Denies recent infectious symptoms.  The history is provided by the patient. No language interpreter was used.   Past Medical History:  Diagnosis Date  . Chicken pox   . Esophagitis   . Fibrocystic breast changes 05/29/2009  . Goiter   . Measles   . Migraines   . Pulmonary congestion    postpartum  . Thyroid disease   . Varicose veins     Patient Active Problem List   Diagnosis Date Noted  . Paresthesias in left hand 07/04/2014  . Menorrhagia 05/02/2014  . Uterine polyp 05/02/2014  . Anemia 06/30/2011  . Abdominal pain 06/30/2011  . Hypothyroidism   . Fibrocystic breast changes 05/11/2011  . Leg pain 05/11/2011  . Hyperthyroidism 05/11/2011    Past Surgical History:  Procedure Laterality Date  . APPENDECTOMY  01/01/2003  . CESAREAN SECTION  01/04/2004  . DILATATION & CURRETTAGE/HYSTEROSCOPY WITH RESECTOCOPE  05/02/2014   Procedure: [place];  Surgeon: [name], MD;  Location: [facility];  Service: Gynecology;;    OB History    Gravida Para Term Preterm AB Living   1 1 1     1    SAB TAB Ectopic Multiple Live Births                   Home Medications    Prior to Admission medications   Medication Sig Start Date End Date Taking? Authorizing Provider  aspirin-acetaminophen-caffeine (EXCEDRIN MIGRAINE) [phone] MG per tablet Take 2 tablets by mouth every 6 (six) hours as needed for headache or migraine.   Yes Historical Provider, MD  eletriptan (RELPAX) 40 MG tablet Take 40 mg by mouth as needed for migraine or headache. One tablet by mouth at onset of headache. May repeat in 2 hours if headache persists or recurs.   Yes Historical Provider, MD  levothyroxine (SYNTHROID, LEVOTHROID) 50 MCG tablet Take 50 mcg by mouth daily.   Yes Historical Provider, MD  artificial tears (LACRILUBE) OINT ophthalmic ointment Place into both eyes every 4 (four) hours as needed for dry eyes. 09/30/15   [name], MD  ibuprofen (ADVIL,MOTRIN) 800 MG tablet Take 1 tablet (800 mg total) by mouth every 8 (eight) hours as needed. Patient not taking: Reported on 09/30/2015 05/02/14   [name], MD  predniSONE (DELTASONE) 20 MG tablet Take 3 tablets (60 mg total) by mouth daily with breakfast. 09/30/15   [name], MD    Family History Family History  Problem  Relation Age of Onset  . Hypertension Mother     Living  . Asthma Father     Deceased, 90  . Healthy Brother   . Healthy Sister   . Asthma Son     Social History Social History  Substance Use Topics  . Smoking status: Never Smoker  . Smokeless tobacco: Never Used  . Alcohol use No    Allergies   Review of patient's allergies indicates no known allergies.   Review of Systems Review of Systems  Constitutional: Negative for fever.  Eyes: Negative for visual disturbance.  Respiratory: Negative for shortness of breath.     Cardiovascular: Negative for chest pain.  Gastrointestinal: Negative for nausea and vomiting.  Neurological: Positive for facial asymmetry. Negative for speech difficulty, weakness and numbness.  All other systems reviewed and are negative.  Physical Exam Updated Vital Signs BP 122/97 (BP Location: Left Arm)   Pulse 96   Temp 98.1 F (36.7 C) (Oral)   Resp 20   Ht 5' (1.524 m)   Wt 113 lb 8 oz (51.5 kg)   LMP 09/03/2015   SpO2 100%   BMI 22.17 kg/m   Physical Exam  Constitutional: She is oriented to person, place, and time. She appears well-developed and well-nourished.  HENT:  Head: Normocephalic.  Eyes: EOM are normal. Pupils are equal, round, and reactive to light.  Neck: Neck supple.  Cardiovascular: Normal rate, regular rhythm and normal heart sounds.   Pulmonary/Chest: Effort normal. No respiratory distress. She has no wheezes.  Abdominal: Soft. Bowel sounds are normal.  Neurological: She is alert and oriented to person, place, and time.  Mild nasolabial fold flattening on the right which corrects with smiling, good closure of the eye, forehead spared, otherwise cranial nerves II through XII intact, 5 out of 5 strength in all 4 extremities, normal reflexes  Skin: Skin is warm and dry.  Psychiatric: She has a normal mood and affect.  Nursing note and vitals reviewed.    ED Treatments / Results  DIAGNOSTIC STUDIES: Oxygen Saturation is 100% on RA, normal by my interpretation.    COORDINATION OF CARE: 2:14 AM-Discussed treatment plan which includes MRI and order of Acyclovir due to possible Bell's Palsy diagnosis with pt at bedside and pt agreed to plan.   Labs (all labs ordered are listed, but only abnormal results are displayed) Labs Reviewed  CBC - Abnormal; Notable for the following:       Result Value   RBC 5.41 (*)    Hemoglobin 11.2 (*)    HCT 34.4 (*)    MCV 63.6 (*)    MCH 20.7 (*)    RDW 17.0 (*)    All other components within normal limits   COMPREHENSIVE METABOLIC PANEL - Abnormal; Notable for the following:    Potassium 3.3 (*)    Glucose, Bld 139 (*)    Total Bilirubin <0.1 (*)    All other components within normal limits  URINALYSIS, ROUTINE W REFLEX MICROSCOPIC (NOT AT [facility]) - Abnormal; Notable for the following:    APPearance CLOUDY (*)    All other components within normal limits  I-STAT CHEM 8, ED - Abnormal; Notable for the following:    Potassium 3.3 (*)    Glucose, Bld 137 (*)    All other components within normal limits  PROTIME-INR  APTT  DIFFERENTIAL  Randolm Idol, ED  POC URINE PREG, ED    EKG  EKG Interpretation  Date/Time:  Sunday September 29 2015  23:21:49 EDT Ventricular Rate:  95 PR Interval:  118 QRS Duration: 88 QT Interval:  352 QTC Calculation: 442 R Axis:   27 Text Interpretation:  Normal sinus rhythm Nonspecific T wave abnormality Abnormal ECG Confirmed by Casandra Dallaire  MD, Loma Sousa ([id number]) on 09/30/2015 1:35:08 AM       Radiology Ct Head Wo Contrast  Result Date: 09/30/2015 CLINICAL DATA:  45 year old female with right-sided facial droop EXAM: CT HEAD WITHOUT CONTRAST TECHNIQUE: Contiguous axial images were obtained from the base of the skull through the vertex without intravenous contrast. COMPARISON:  None. FINDINGS: The ventricles and the sulci are appropriate in size for the patient's age. There is no intracranial hemorrhage. No midline shift or mass effect identified. The gray-white matter differentiation is preserved. The visualized paranasal sinuses and mastoid air cells are well aerated. The calvarium is intact. IMPRESSION: No acute intracranial pathology. Electronically Signed   By: Anner Crete M.D.   On: 09/30/2015 00:59  Mr Brain Wo Contrast  Result Date: 09/30/2015 CLINICAL DATA:  Worsening RIGHT facial droop beginning at 1400 hours yesterday. Tongue heaviness. History of migraines. EXAM: MRI HEAD WITHOUT CONTRAST TECHNIQUE: Multiplanar, multiecho pulse sequences of the brain  and surrounding structures were obtained without intravenous contrast. COMPARISON:  CT HEAD September 30, 2015 at 0054 hours. FINDINGS: INTRACRANIAL CONTENTS: No reduced diffusion to suggest acute ischemia. No susceptibility artifact to suggest hemorrhage. The ventricles and sulci are normal for patient's age. No suspicious parenchymal signal, masses or mass effect. No abnormal extra-axial fluid collections. No extra-axial masses though, contrast enhanced sequences would be more sensitive. Normal major intracranial vascular flow voids present at skull base. ORBITS: The included ocular globes and orbital contents are non-suspicious. SINUSES: RIGHT maxillary sinus mucosal thickening. Mastoid air cells are well aerated. SKULL/SOFT TISSUES: No abnormal sellar expansion. No suspicious calvarial bone marrow signal. Craniocervical junction maintained. Irregular T2 hyperintensity of the nasal labial folds most compatible with injections. IMPRESSION: Normal noncontrast MRI head. Electronically Signed   By: Elon Alas M.D.   On: 09/30/2015 03:43   Procedures Procedures (including critical care time)  Medications Ordered in ED Medications - No data to display   Initial Impression / Assessment and Plan / ED Course  I have reviewed the triage vital signs and the nursing notes.  Pertinent labs & imaging results that were available during my care of the patient were reviewed by me and considered in my medical decision making (see chart for details).  Clinical Course   Patient presents with concerns for facial droop on the right.  Symptoms are very subtle. She has mild nasolabial fold flattening on the right but otherwise has very mild symptoms. Her presentation is most consistent with Bell's palsy. Stroke workup was sent from triage and is all reassuring. I discussed my suspected diagnosis with the patient. Patient reports a history of migraines. While she does not have a headache right now, she is concerned  that she may be having a stroke. I have reassured the patient. She is very low risk. She has no other neurologic symptoms and she is a classic Bell's palsy. Patient is requesting MRI.  I discussed with the patient that at this time I do not feel this is necessary given how reassuring her exam is; however, patient reports that she has much anxiety related to the possibility of a stroke. MRI was obtained and is normal. Will treat with prednisone and Lacri-Lube.  After history, exam, and medical workup I feel the patient has been appropriately medically  screened and is safe for discharge home. Pertinent diagnoses were discussed with the patient. Patient was given return precautions.   Final Clinical Impressions(s) / ED Diagnoses   Final diagnoses:  Bell's palsy    New Prescriptions New Prescriptions   ARTIFICIAL TEARS (LACRILUBE) OINT OPHTHALMIC OINTMENT    Place into both eyes every 4 (four) hours as needed for dry eyes.   PREDNISONE (DELTASONE) 20 MG TABLET    Take 3 tablets (60 mg total) by mouth daily with breakfast.   I personally performed the services described in this documentation, which was scribed in my presence. The recorded information has been reviewed and is accurate.     [name], MD 09/30/15 [phone]

## 2015-09-30 NOTE — ED Notes (Signed)
Pt declined to have discharge vitals taken.

## 2015-11-21 DIAGNOSIS — Z1231 Encounter for screening mammogram for malignant neoplasm of breast: Secondary | ICD-10-CM | POA: Diagnosis not present

## 2015-11-28 DIAGNOSIS — E042 Nontoxic multinodular goiter: Secondary | ICD-10-CM | POA: Diagnosis not present

## 2015-11-28 DIAGNOSIS — E049 Nontoxic goiter, unspecified: Secondary | ICD-10-CM | POA: Diagnosis not present

## 2015-11-28 DIAGNOSIS — E010 Iodine-deficiency related diffuse (endemic) goiter: Secondary | ICD-10-CM | POA: Diagnosis not present

## 2015-12-09 DIAGNOSIS — Z13220 Encounter for screening for lipoid disorders: Secondary | ICD-10-CM | POA: Diagnosis not present

## 2015-12-09 DIAGNOSIS — E039 Hypothyroidism, unspecified: Secondary | ICD-10-CM | POA: Diagnosis not present

## 2015-12-09 DIAGNOSIS — Z0000 Encounter for general adult medical examination without abnormal findings: Secondary | ICD-10-CM | POA: Diagnosis not present

## 2015-12-09 DIAGNOSIS — D649 Anemia, unspecified: Secondary | ICD-10-CM | POA: Diagnosis not present

## 2015-12-09 DIAGNOSIS — Z79899 Other long term (current) drug therapy: Secondary | ICD-10-CM | POA: Diagnosis not present

## 2016-04-22 DIAGNOSIS — Z6823 Body mass index (BMI) 23.0-23.9, adult: Secondary | ICD-10-CM | POA: Diagnosis not present

## 2016-04-22 DIAGNOSIS — Z01411 Encounter for gynecological examination (general) (routine) with abnormal findings: Secondary | ICD-10-CM | POA: Diagnosis not present

## 2016-04-22 DIAGNOSIS — Z124 Encounter for screening for malignant neoplasm of cervix: Secondary | ICD-10-CM | POA: Diagnosis not present

## 2016-04-23 ENCOUNTER — Other Ambulatory Visit: Payer: Self-pay | Admitting: Neurology

## 2016-04-23 DIAGNOSIS — R109 Unspecified abdominal pain: Secondary | ICD-10-CM | POA: Diagnosis not present

## 2016-04-23 DIAGNOSIS — R1011 Right upper quadrant pain: Secondary | ICD-10-CM | POA: Diagnosis not present

## 2016-04-28 DIAGNOSIS — R1031 Right lower quadrant pain: Secondary | ICD-10-CM | POA: Diagnosis not present

## 2016-04-29 ENCOUNTER — Other Ambulatory Visit (HOSPITAL_COMMUNITY): Payer: Self-pay | Admitting: General Surgery

## 2016-04-29 DIAGNOSIS — R1031 Right lower quadrant pain: Secondary | ICD-10-CM

## 2016-05-04 ENCOUNTER — Ambulatory Visit (HOSPITAL_COMMUNITY)
Admission: RE | Admit: 2016-05-04 | Discharge: 2016-05-04 | Disposition: A | Discharge status: Home / Self Care | Payer: Commercial Managed Care - PPO | Source: Ambulatory Visit | Attending: General Surgery | Admitting: General Surgery

## 2016-05-04 DIAGNOSIS — R1031 Right lower quadrant pain: Secondary | ICD-10-CM | POA: Insufficient documentation

## 2016-05-04 DIAGNOSIS — R1011 Right upper quadrant pain: Secondary | ICD-10-CM | POA: Diagnosis not present

## 2016-05-04 MED ORDER — TECHNETIUM TC 99M MEBROFENIN IV KIT
5.2900 | PACK | Freq: Once | INTRAVENOUS | Status: AC | PRN
  Administered 2016-05-04: 5.29 via INTRAVENOUS

## 2016-05-05 ENCOUNTER — Ambulatory Visit: Payer: Self-pay | Admitting: General Surgery

## 2016-05-05 DIAGNOSIS — K811 Chronic cholecystitis: Secondary | ICD-10-CM | POA: Diagnosis not present

## 2016-05-21 DIAGNOSIS — R933 Abnormal findings on diagnostic imaging of other parts of digestive tract: Secondary | ICD-10-CM | POA: Diagnosis not present

## 2016-05-21 DIAGNOSIS — R1011 Right upper quadrant pain: Secondary | ICD-10-CM | POA: Diagnosis not present

## 2016-05-21 DIAGNOSIS — K811 Chronic cholecystitis: Secondary | ICD-10-CM | POA: Diagnosis not present

## 2016-05-27 DIAGNOSIS — R1013 Epigastric pain: Secondary | ICD-10-CM | POA: Diagnosis not present

## 2016-05-27 DIAGNOSIS — R1111 Vomiting without nausea: Secondary | ICD-10-CM | POA: Diagnosis not present

## 2016-05-27 DIAGNOSIS — R1011 Right upper quadrant pain: Secondary | ICD-10-CM | POA: Diagnosis not present

## 2016-05-27 DIAGNOSIS — R110 Nausea: Secondary | ICD-10-CM | POA: Diagnosis not present

## 2016-05-27 DIAGNOSIS — K219 Gastro-esophageal reflux disease without esophagitis: Secondary | ICD-10-CM | POA: Diagnosis not present

## 2016-05-28 ENCOUNTER — Emergency Department (HOSPITAL_COMMUNITY)
Admission: EM | Admit: 2016-05-28 | Discharge: 2016-05-28 | Disposition: A | Discharge status: Home / Self Care | Payer: Commercial Managed Care - PPO | Attending: Emergency Medicine | Admitting: Emergency Medicine

## 2016-05-28 ENCOUNTER — Encounter (HOSPITAL_COMMUNITY): Payer: Self-pay | Admitting: Emergency Medicine

## 2016-05-28 DIAGNOSIS — R110 Nausea: Secondary | ICD-10-CM | POA: Diagnosis not present

## 2016-05-28 DIAGNOSIS — E039 Hypothyroidism, unspecified: Secondary | ICD-10-CM | POA: Diagnosis not present

## 2016-05-28 DIAGNOSIS — Z7982 Long term (current) use of aspirin: Secondary | ICD-10-CM | POA: Diagnosis not present

## 2016-05-28 DIAGNOSIS — R1011 Right upper quadrant pain: Secondary | ICD-10-CM | POA: Diagnosis not present

## 2016-05-28 LAB — URINALYSIS, ROUTINE W REFLEX MICROSCOPIC
Bilirubin Urine: NEGATIVE
Glucose, UA: NEGATIVE mg/dL
Hgb urine dipstick: NEGATIVE
KETONES UR: 5 mg/dL — AB
LEUKOCYTES UA: NEGATIVE
NITRITE: NEGATIVE
PROTEIN: NEGATIVE mg/dL
Specific Gravity, Urine: 1.024 (ref 1.005–1.030)
pH: 5 (ref 5.0–8.0)

## 2016-05-28 LAB — CBC
HCT: 32.9 % — ABNORMAL LOW (ref 36.0–46.0)
Hemoglobin: 10.9 g/dL — ABNORMAL LOW (ref 12.0–15.0)
MCH: 21.2 pg — ABNORMAL LOW (ref 26.0–34.0)
MCHC: 33.1 g/dL (ref 30.0–36.0)
MCV: 63.9 fL — AB (ref 78.0–100.0)
PLATELETS: 226 10*3/uL (ref 150–400)
RBC: 5.15 MIL/uL — ABNORMAL HIGH (ref 3.87–5.11)
RDW: 17.8 % — AB (ref 11.5–15.5)
WBC: 7.6 10*3/uL (ref 4.0–10.5)

## 2016-05-28 LAB — COMPREHENSIVE METABOLIC PANEL
ALT: 23 U/L (ref 14–54)
AST: 19 U/L (ref 15–41)
Albumin: 3.7 g/dL (ref 3.5–5.0)
Alkaline Phosphatase: 44 U/L (ref 38–126)
Anion gap: 9 (ref 5–15)
BILIRUBIN TOTAL: 0.1 mg/dL — AB (ref 0.3–1.2)
BUN: 13 mg/dL (ref 6–20)
CALCIUM: 9.2 mg/dL (ref 8.9–10.3)
CO2: 22 mmol/L (ref 22–32)
CREATININE: 0.67 mg/dL (ref 0.44–1.00)
Chloride: 107 mmol/L (ref 101–111)
GFR calc non Af Amer: >60 mL/min (ref >60)
Glucose, Bld: 123 mg/dL — ABNORMAL HIGH (ref 65–99)
Potassium: 3.7 mmol/L (ref 3.5–5.1)
Sodium: 138 mmol/L (ref 135–145)
TOTAL PROTEIN: 6.7 g/dL (ref 6.5–8.1)

## 2016-05-28 LAB — LIPASE, BLOOD: Lipase: 19 U/L (ref 11–51)

## 2016-05-28 MED ORDER — ONDANSETRON 4 MG PO TBDP
8.0000 mg | ORAL_TABLET | Freq: Once | ORAL | Status: AC
  Administered 2016-05-28: 8 mg via ORAL
  Filled 2016-05-28: qty 2

## 2016-05-28 MED ORDER — KETOROLAC TROMETHAMINE 30 MG/ML IJ SOLN
30.0000 mg | Freq: Once | INTRAMUSCULAR | Status: AC
  Administered 2016-05-28: 30 mg via INTRAVENOUS
  Filled 2016-05-28: qty 1

## 2016-05-28 MED ORDER — HYDROCODONE-ACETAMINOPHEN 5-325 MG PO TABS: 2.0000 | ORAL_TABLET | ORAL | 0 refills | Status: DC | PRN

## 2016-05-28 MED ORDER — ONDANSETRON HCL 4 MG PO TABS: 4.0000 mg | ORAL_TABLET | Freq: Four times a day (QID) | ORAL | 0 refills | Status: DC

## 2016-05-28 MED ORDER — ONDANSETRON HCL 4 MG PO TABS: 4.0000 mg | ORAL_TABLET | Freq: Once | ORAL | Status: DC

## 2016-05-28 NOTE — ED Notes (Signed)
Pt given specimen cup. 

## 2016-05-28 NOTE — Discharge Instructions (Signed)
Take Zofran as needed for nausea and Norco as needed for pain. Please schedule appointment with surgery for discussion of further evaluation and treatment options, including surgery. Return to ED for persistent pain, fever, or trouble breathing.

## 2016-05-28 NOTE — ED Triage Notes (Signed)
Pt recently diagnosed with inflamed gallbladder, reports RUQ pain (burning) that began around 0300 this am. Pt reports Dr. Hulen Skains wanted to do surgery but she opted not to at the time. Pt reports she took a percocet around 0330 this am. Pt denies vomiting.

## 2016-05-28 NOTE — ED Provider Notes (Signed)
[facility] DEPT Provider Note   CSN: [id number] Arrival date & time: 05/28/16  0532     History   Chief Complaint Chief Complaint  Patient presents with  . Abdominal Pain    HPI [name] is a 46 y.o. female.  Patient with pmh of inflamed gallbladder presents with 4hr history of RUQ pain rated as 8/10 at onset, with radiation to epigastric area and no associated nausea or vomiting. Last month she was told she has an inflamed gallbladder seen via U/S. She was referred to Dr. Hulen Skains who did a HIDA scan which was negative. After being referred to GI, she began a low fat diet because she elected not to have surgery. This was successful in keeping pain down until last night. She states the pain has alleviated with Percocet. She has a history of constipation. Denies blood in stool, urinary complaints, vaginal complaints or appetite changes.       Past Medical History:  Diagnosis Date  . Chicken pox   . Esophagitis   . Fibrocystic breast changes 05/29/2009  . Goiter   . Measles   . Migraines   . Pulmonary congestion    postpartum  . Thyroid disease   . Varicose veins     Patient Active Problem List   Diagnosis Date Noted  . Paresthesias in left hand 07/04/2014  . Menorrhagia 05/02/2014  . Uterine polyp 05/02/2014  . Anemia 06/30/2011  . Abdominal pain 06/30/2011  . Hypothyroidism   . Fibrocystic breast changes 05/11/2011  . Leg pain 05/11/2011  . Hyperthyroidism 05/11/2011    Past Surgical History:  Procedure Laterality Date  . APPENDECTOMY  01/01/2003  . CESAREAN SECTION  01/04/2004  . DILATATION & CURRETTAGE/HYSTEROSCOPY WITH RESECTOCOPE  05/02/2014   Procedure: [place];  Surgeon: [name], MD;  Location: [facility];  Service: Gynecology;;    OB History    Gravida Para Term Preterm AB Living   1 1 1     1    SAB TAB Ectopic Multiple Live Births                   Home Medications    Prior to  Admission medications   Medication Sig Start Date End Date Taking? Authorizing Provider  artificial tears (LACRILUBE) OINT ophthalmic ointment Place into both eyes every 4 (four) hours as needed for dry eyes. 09/30/15   [name], MD  aspirin-acetaminophen-caffeine (EXCEDRIN MIGRAINE) [phone] MG per tablet Take 2 tablets by mouth every 6 (six) hours as needed for headache or migraine.    Historical Provider, MD  eletriptan (RELPAX) 40 MG tablet Take 40 mg by mouth as needed for migraine or headache. One tablet by mouth at onset of headache. May repeat in 2 hours if headache persists or recurs.    Historical Provider, MD  HYDROcodone-acetaminophen (NORCO/VICODIN) 5-325 MG tablet Take 2 tablets by mouth every 4 (four) hours as needed. 05/28/16   Yossi Hinchman, PA  ibuprofen (ADVIL,MOTRIN) 800 MG tablet Take 1 tablet (800 mg total) by mouth every 8 (eight) hours as needed. Patient not taking: Reported on 09/30/2015 05/02/14   [name], MD  levothyroxine (SYNTHROID, LEVOTHROID) 50 MCG tablet Take 50 mcg by mouth daily.    Historical Provider, MD  ondansetron (ZOFRAN) 4 MG tablet Take 1 tablet (4 mg total) by mouth every 6 (six) hours. 05/28/16   Briane Birden, PA  predniSONE (DELTASONE) 20 MG tablet Take 3 tablets (60 mg total) by mouth daily  with breakfast. 09/30/15   [name], MD    Family History Family History  Problem Relation Age of Onset  . Hypertension Mother     Living  . Asthma Father     Deceased, 80  . Healthy Brother   . Healthy Sister   . Asthma Son     Social History Social History  Substance Use Topics  . Smoking status: Never Smoker  . Smokeless tobacco: Never Used  . Alcohol use No     Allergies   Patient has no known allergies.   Review of Systems Review of Systems  Constitutional: Negative for appetite change, chills and fever.  HENT: Negative for rhinorrhea, sneezing and sore throat.   Eyes: Negative for photophobia and visual disturbance.    Respiratory: Negative for cough, chest tightness and shortness of breath.   Cardiovascular: Negative for chest pain, palpitations and leg swelling.  Gastrointestinal: Positive for abdominal pain and constipation. Negative for blood in stool, diarrhea, nausea and vomiting.  Endocrine: Negative for polyuria.  Genitourinary: Negative for dysuria, hematuria, urgency, vaginal bleeding, vaginal discharge and vaginal pain.  Musculoskeletal: Negative for myalgias.  Skin: Negative for rash.  Neurological: Negative for dizziness, weakness and light-headedness.     Physical Exam Updated Vital Signs BP 93/66 (BP Location: Left Arm)   Pulse 71   Temp 98.2 F (36.8 C) (Oral)   Resp 12   SpO2 100%   Physical Exam  Constitutional: She appears well-developed and well-nourished. No distress.  HENT:  Head: Normocephalic and atraumatic.  Nose: Nose normal.  Eyes: Conjunctivae and EOM are normal. Right eye exhibits no discharge. Left eye exhibits no discharge. No scleral icterus.  Neck: Normal range of motion. Neck supple.  Cardiovascular: Normal rate, regular rhythm, normal heart sounds and intact distal pulses.   Pulmonary/Chest: Effort normal and breath sounds normal. No respiratory distress.  Abdominal: Soft. Bowel sounds are normal. She exhibits no distension. There is tenderness (epigastric and RUQ tenderness). There is positive Murphy's sign. There is no rebound, no guarding and no tenderness at McBurney's point.  Musculoskeletal: Normal range of motion. She exhibits no edema.  Neurological: She is alert. She exhibits normal muscle tone. Coordination normal.  Skin: Skin is warm and dry. No rash noted.  Psychiatric: She has a normal mood and affect.  Nursing note and vitals reviewed.    ED Treatments / Results  Labs (all labs ordered are listed, but only abnormal results are displayed) Labs Reviewed  COMPREHENSIVE METABOLIC PANEL - Abnormal; Notable for the following:       Result Value    Glucose, Bld 123 (*)    Total Bilirubin 0.1 (*)    All other components within normal limits  CBC - Abnormal; Notable for the following:    RBC 5.15 (*)    Hemoglobin 10.9 (*)    HCT 32.9 (*)    MCV 63.9 (*)    MCH 21.2 (*)    RDW 17.8 (*)    All other components within normal limits  URINALYSIS, ROUTINE W REFLEX MICROSCOPIC - Abnormal; Notable for the following:    APPearance HAZY (*)    Ketones, ur 5 (*)    All other components within normal limits  LIPASE, BLOOD    EKG  EKG Interpretation None       Radiology No results found.  Procedures Procedures (including critical care time)  Medications Ordered in ED Medications  ketorolac (TORADOL) 30 MG/ML injection 30 mg (30 mg Intravenous Given 05/28/16  5284)  ondansetron (ZOFRAN-ODT) disintegrating tablet 8 mg (8 mg Oral Given 05/28/16 0844)     Initial Impression / Assessment and Plan / ED Course  I have reviewed the triage vital signs and the nursing notes.  Pertinent labs & imaging results that were available during my care of the patient were reviewed by me and considered in my medical decision making (see chart for details).     Patient's history of inflamed gallbladder could be causing these symptoms. Her previous RUQ U/S showed moderate thickening of GB but HIDA scan was negative. States her EGD showed no abnormalities. Improvement of symptoms with low fat diet. Will not do any further imaging at this time since there are no changes to previous symptoms, vitals stable and labwork unremarkable. Symptoms were controlled in ED with toradol and Zofran. Advised to follow up general surgery for any further treatment options, including surgery. We will let surgery know that she is here but will be unable to do anything further. Zofran and Norco prn given for symptom control. Patient agreed to plan and return precautions were given.  Patient was discussed with and seen by Dr. Ashok Cordia.   Final Clinical Impressions(s)  / ED Diagnoses   Final diagnoses:  Right upper quadrant abdominal pain  Nausea    New Prescriptions Discharge Medication List as of 05/28/2016  9:51 AM    START taking these medications   Details  HYDROcodone-acetaminophen (NORCO/VICODIN) 5-325 MG tablet Take 2 tablets by mouth every 4 (four) hours as needed., Starting Thu 05/28/2016, Print    ondansetron (ZOFRAN) 4 MG tablet Take 1 tablet (4 mg total) by mouth every 6 (six) hours., Starting Thu 05/28/2016, [place], [place] 05/28/16 [place], MD 05/28/16 1655

## 2016-05-28 NOTE — ED Notes (Signed)
Pt unable to void at this time. 

## 2016-06-03 ENCOUNTER — Ambulatory Visit: Payer: Self-pay | Admitting: General Surgery

## 2016-07-09 DIAGNOSIS — K819 Cholecystitis, unspecified: Secondary | ICD-10-CM | POA: Diagnosis not present

## 2016-07-13 ENCOUNTER — Encounter (HOSPITAL_COMMUNITY)
Admission: RE | Admit: 2016-07-13 | Discharge: 2016-07-13 | Disposition: A | Discharge status: Home / Self Care | Payer: Commercial Managed Care - PPO | Source: Ambulatory Visit | Attending: General Surgery | Admitting: General Surgery

## 2016-07-13 ENCOUNTER — Encounter (HOSPITAL_COMMUNITY): Payer: Self-pay

## 2016-07-13 DIAGNOSIS — Z01812 Encounter for preprocedural laboratory examination: Secondary | ICD-10-CM | POA: Diagnosis not present

## 2016-07-13 DIAGNOSIS — K811 Chronic cholecystitis: Secondary | ICD-10-CM | POA: Insufficient documentation

## 2016-07-13 HISTORY — DX: Anemia, unspecified: D64.9

## 2016-07-13 HISTORY — DX: Hypothyroidism, unspecified: E03.9

## 2016-07-13 HISTORY — DX: Gastro-esophageal reflux disease without esophagitis: K21.9

## 2016-07-13 LAB — CBC WITH DIFFERENTIAL/PLATELET
Basophils Absolute: 0.1 10*3/uL (ref 0.0–0.1)
Basophils Relative: 1 %
EOS ABS: 0.1 10*3/uL (ref 0.0–0.7)
Eosinophils Relative: 1 %
HEMATOCRIT: 35.6 % — AB (ref 36.0–46.0)
HEMOGLOBIN: 11.4 g/dL — AB (ref 12.0–15.0)
LYMPHS ABS: 1.8 10*3/uL (ref 0.7–4.0)
Lymphocytes Relative: 33 %
MCH: 21.1 pg — AB (ref 26.0–34.0)
MCHC: 32 g/dL (ref 30.0–36.0)
MCV: 65.8 fL — ABNORMAL LOW (ref 78.0–100.0)
MONO ABS: 0.3 10*3/uL (ref 0.1–1.0)
Monocytes Relative: 6 %
Neutro Abs: 3.2 10*3/uL (ref 1.7–7.7)
Neutrophils Relative %: 59 %
Platelets: 230 10*3/uL (ref 150–400)
RBC: 5.41 MIL/uL — ABNORMAL HIGH (ref 3.87–5.11)
RDW: 16 % — ABNORMAL HIGH (ref 11.5–15.5)
WBC: 5.5 10*3/uL (ref 4.0–10.5)

## 2016-07-13 LAB — COMPREHENSIVE METABOLIC PANEL
ALBUMIN: 4 g/dL (ref 3.5–5.0)
ALK PHOS: 41 U/L (ref 38–126)
ALT: 19 U/L (ref 14–54)
ANION GAP: 6 (ref 5–15)
AST: 22 U/L (ref 15–41)
BILIRUBIN TOTAL: 0.6 mg/dL (ref 0.3–1.2)
BUN: 9 mg/dL (ref 6–20)
CALCIUM: 9.3 mg/dL (ref 8.9–10.3)
CO2: 25 mmol/L (ref 22–32)
CREATININE: 0.6 mg/dL (ref 0.44–1.00)
Chloride: 105 mmol/L (ref 101–111)
GFR calc Af Amer: >60 mL/min (ref >60)
GFR calc non Af Amer: >60 mL/min (ref >60)
Glucose, Bld: 102 mg/dL — ABNORMAL HIGH (ref 65–99)
Potassium: 4.1 mmol/L (ref 3.5–5.1)
Sodium: 136 mmol/L (ref 135–145)
TOTAL PROTEIN: 7 g/dL (ref 6.5–8.1)

## 2016-07-13 LAB — HCG, SERUM, QUALITATIVE: PREG SERUM: NEGATIVE

## 2016-07-13 NOTE — Pre-Procedure Instructions (Addendum)
TAMARA MONTEITH  07/13/2016      [id number] - [address], [address] [place] [address] Phone: [phone] Fax: [phone]    Your procedure is scheduled on 07/20/16.  Report to [facility] Admitting at 530 A.M.  Call this number if you have problems the morning of surgery:  [phone]   Remember:  Do not eat food or drink liquids after midnight.  Take these medicines the morning of surgery with A SIP OF WATER     Levothyroxine, pain med if needed  STOP all herbel meds, nsaids (aleve,naproxen,advil,ibuprofen)   prior to surgery starting Today 07/13/16 including all vitamins/supplements,aspirin, excedrin migraine   Do not wear jewelry, make-up or nail polish.  Do not wear lotions, powders, or perfumes, or deoderant.  Do not shave 48 hours prior to surgery.  Men may shave face and neck.  Do not bring valuables to the hospital.  [facility] is not responsible for any belongings or valuables.  Contacts, dentures or bridgework may not be worn into surgery.  Leave your suitcase in the car.  After surgery it may be brought to your room.  For patients admitted to the hospital, discharge time will be determined by your treatment team.  Patients discharged the day of surgery will not be allowed to drive home.   Special instructions:   Special Instructions: [place] - Preparing for Surgery  Before surgery, you can play an important role.  Because skin is not sterile, your skin needs to be as free of germs as possible.  You can reduce the number of germs on you skin by washing with CHG (chlorahexidine gluconate) soap before surgery.  CHG is an antiseptic cleaner which kills germs and bonds with the skin to continue killing germs even after washing.  Please DO NOT use if you have an allergy to CHG or antibacterial soaps.  If your skin becomes reddened/irritated stop using the CHG and inform your nurse when you arrive at  Short Stay.  Do not shave (including legs and underarms) for at least 48 hours prior to the first CHG shower.  You may shave your face.  Please follow these instructions carefully:   1.  Shower with CHG Soap the night before surgery and the morning of Surgery.  2.  If you choose to wash your hair, wash your hair first as usual with your normal shampoo.  3.  After you shampoo, rinse your hair and body thoroughly to remove the Shampoo.  4.  Use CHG as you would any other liquid soap.  You can apply chg directly  to the skin and wash gently with scrungie or a clean washcloth.  5.  Apply the CHG Soap to your body ONLY FROM THE NECK DOWN.  Do not use on open wounds or open sores.  Avoid contact with your eyes ears, mouth and genitals (private parts).  Wash genitals (private parts)       with your normal soap.  6.  Wash thoroughly, paying special attention to the area where your surgery will be performed.  7.  Thoroughly rinse your body with warm water from the neck down.  8.  DO NOT shower/wash with your normal soap after using and rinsing off the CHG Soap.  9.  Pat yourself dry with a clean towel.            10.  Wear clean pajamas.  11.  Place clean sheets on your bed the night of your first shower and do not sleep with pets.  Day of Surgery  Do not apply any lotions/deodorants the morning of surgery.  Please wear clean clothes to the hospital/surgery center.  Please read over the  fact sheets that you were given.

## 2016-07-13 NOTE — Progress Notes (Signed)
Echo done 05 after c section ?     pe   Neg no further cardiac needs or tests

## 2016-07-19 NOTE — H&P (Signed)
[name] is an 46 y.o. female.  With multiple episodes of RUQ pain, a symptomatic HIDA scan Chief Complaint: Right upper quadrant pain HPI: The patient has been having symptoms of  RUQ pain and an ultrasound demonstrating a thickened GB wall, but no stones.  Her ejection fraction on her HIDA scan done on 05/04/16 was 48%, but the patient was symptomatic.  Past Medical History:  Diagnosis Date  . Anemia    hx  . Chicken pox   . Esophagitis   . Fibrocystic breast changes 05/29/2009  . GERD (gastroesophageal reflux disease)    hx  . Goiter   . Hypothyroidism   . Measles   . Migraines   . Pulmonary congestion    postpartum  . Thyroid disease   . Varicose veins     Past Surgical History:  Procedure Laterality Date  . APPENDECTOMY  01/01/2003  . CESAREAN SECTION  01/04/2004  . DILATATION & CURRETTAGE/HYSTEROSCOPY WITH RESECTOCOPE  05/02/2014   Procedure: DILATATION & CURETTAGE/HYSTEROSCOPY WITH RESECTOCOPE;  Surgeon: [name], MD;  Location: [facility];  Service: Gynecology;;    Family History  Problem Relation Age of Onset  . Hypertension Mother        Living  . Asthma Father        Deceased, 2  . Healthy Brother   . Healthy Sister   . Asthma Son    Social History:  reports that she has never smoked. She has never used smokeless tobacco. She reports that she does not drink alcohol or use drugs.  Allergies:  Allergies  Allergen Reactions  . No Known Allergies     No prescriptions prior to admission.    No results found for this or any previous visit (from the past 48 hour(s)). No results found.  Review of Systems  Constitutional: Negative for chills and fever.  Gastrointestinal: Positive for abdominal pain and nausea.    Last menstrual period 07/02/2016. Physical Exam  Constitutional: She is oriented to person, place, and time. She appears well-developed and well-nourished.  HENT:  Head: Normocephalic and atraumatic.  Eyes: Conjunctivae and EOM are  normal. Pupils are equal, round, and reactive to light.  Neck: Normal range of motion. Neck supple.  Cardiovascular: Normal rate, regular rhythm and normal heart sounds.   Respiratory: Effort normal.  GI: Soft. Bowel sounds are normal. There is tenderness (Mild RUQ).  Musculoskeletal: Normal range of motion.  Neurological: She is alert and oriented to person, place, and time. She has normal reflexes.  Skin: Skin is warm and dry.  Psychiatric: She has a normal mood and affect. Her behavior is normal. Judgment and thought content normal.     Assessment/Plan Probably chronic cholecystitis, possibly small stones.  For laparoscopic cholecystectomy with IOC. Should be able to go home today.  Judeth Horn, MD 07/19/2016, 11:21 PM

## 2016-07-20 ENCOUNTER — Ambulatory Visit (HOSPITAL_COMMUNITY): Payer: Commercial Managed Care - PPO | Admitting: Certified Registered"

## 2016-07-20 ENCOUNTER — Encounter (HOSPITAL_COMMUNITY)
Admission: RE | Disposition: A | Discharge status: Home / Self Care | Payer: Self-pay | Source: Ambulatory Visit | Attending: General Surgery

## 2016-07-20 ENCOUNTER — Encounter (HOSPITAL_COMMUNITY): Payer: Self-pay | Admitting: Surgery

## 2016-07-20 ENCOUNTER — Ambulatory Visit (HOSPITAL_COMMUNITY): Payer: Commercial Managed Care - PPO

## 2016-07-20 ENCOUNTER — Ambulatory Visit (HOSPITAL_COMMUNITY)
Admission: RE | Admit: 2016-07-20 | Discharge: 2016-07-20 | Disposition: A | Discharge status: Home / Self Care | Payer: Commercial Managed Care - PPO | Source: Ambulatory Visit | Attending: General Surgery | Admitting: General Surgery

## 2016-07-20 DIAGNOSIS — K811 Chronic cholecystitis: Secondary | ICD-10-CM | POA: Diagnosis not present

## 2016-07-20 DIAGNOSIS — K819 Cholecystitis, unspecified: Secondary | ICD-10-CM | POA: Diagnosis not present

## 2016-07-20 DIAGNOSIS — E039 Hypothyroidism, unspecified: Secondary | ICD-10-CM | POA: Diagnosis not present

## 2016-07-20 DIAGNOSIS — K219 Gastro-esophageal reflux disease without esophagitis: Secondary | ICD-10-CM | POA: Diagnosis not present

## 2016-07-20 DIAGNOSIS — K824 Cholesterolosis of gallbladder: Secondary | ICD-10-CM | POA: Diagnosis not present

## 2016-07-20 DIAGNOSIS — K828 Other specified diseases of gallbladder: Secondary | ICD-10-CM | POA: Diagnosis not present

## 2016-07-20 DIAGNOSIS — R1011 Right upper quadrant pain: Secondary | ICD-10-CM | POA: Diagnosis not present

## 2016-07-20 HISTORY — PX: CHOLECYSTECTOMY: SHX55

## 2016-07-20 SURGERY — LAPAROSCOPIC CHOLECYSTECTOMY WITH INTRAOPERATIVE CHOLANGIOGRAM
Anesthesia: General | Site: Abdomen

## 2016-07-20 MED ORDER — ROCURONIUM BROMIDE 100 MG/10ML IV SOLN
INTRAVENOUS | Status: DC | PRN
  Administered 2016-07-20: 50 mg via INTRAVENOUS

## 2016-07-20 MED ORDER — SCOPOLAMINE 1 MG/3DAYS TD PT72
MEDICATED_PATCH | TRANSDERMAL | Status: DC | PRN
  Administered 2016-07-20: 1 via TRANSDERMAL

## 2016-07-20 MED ORDER — OXYCODONE HCL 5 MG/5ML PO SOLN: 5.0000 mg | Freq: Once | ORAL | Status: AC | PRN

## 2016-07-20 MED ORDER — ONDANSETRON HCL 4 MG/2ML IJ SOLN
INTRAMUSCULAR | Status: AC
  Filled 2016-07-20: qty 2

## 2016-07-20 MED ORDER — HYDROCODONE-ACETAMINOPHEN 5-325 MG PO TABS: 1.0000 | ORAL_TABLET | ORAL | 0 refills | Status: DC | PRN

## 2016-07-20 MED ORDER — SUGAMMADEX SODIUM 200 MG/2ML IV SOLN
INTRAVENOUS | Status: DC | PRN
  Administered 2016-07-20: 200 mg via INTRAVENOUS

## 2016-07-20 MED ORDER — CHLORHEXIDINE GLUCONATE CLOTH 2 % EX PADS: 6.0000 | MEDICATED_PAD | Freq: Once | CUTANEOUS | Status: DC

## 2016-07-20 MED ORDER — BUPIVACAINE HCL (PF) 0.25 % IJ SOLN
INTRAMUSCULAR | Status: AC
  Filled 2016-07-20: qty 30

## 2016-07-20 MED ORDER — BUPIVACAINE HCL (PF) 0.25 % IJ SOLN
INTRAMUSCULAR | Status: DC | PRN
  Administered 2016-07-20: 14 mL

## 2016-07-20 MED ORDER — SODIUM CHLORIDE 0.9 % IV SOLN
INTRAVENOUS | Status: DC | PRN
  Administered 2016-07-20: 5 mL

## 2016-07-20 MED ORDER — PROMETHAZINE HCL 25 MG PO TABS
25.0000 mg | ORAL_TABLET | Freq: Once | ORAL | Status: AC
Start: 2016-07-20 — End: 2016-07-20
  Administered 2016-07-20: 25 mg via ORAL
  Filled 2016-07-20 (×2): qty 1

## 2016-07-20 MED ORDER — PROMETHAZINE HCL 12.5 MG PO TABS: 12.5000 mg | ORAL_TABLET | Freq: Four times a day (QID) | ORAL | 3 refills | Status: DC | PRN

## 2016-07-20 MED ORDER — FENTANYL CITRATE (PF) 100 MCG/2ML IJ SOLN
INTRAMUSCULAR | Status: DC | PRN
  Administered 2016-07-20: 25 ug via INTRAVENOUS
  Administered 2016-07-20: 100 ug via INTRAVENOUS

## 2016-07-20 MED ORDER — MIDAZOLAM HCL 2 MG/2ML IJ SOLN
INTRAMUSCULAR | Status: AC
  Filled 2016-07-20: qty 2

## 2016-07-20 MED ORDER — MIDAZOLAM HCL 5 MG/5ML IJ SOLN
INTRAMUSCULAR | Status: DC | PRN
  Administered 2016-07-20 (×2): 1 mg via INTRAVENOUS

## 2016-07-20 MED ORDER — GABAPENTIN 300 MG PO CAPS
300.0000 mg | ORAL_CAPSULE | ORAL | Status: AC
  Administered 2016-07-20: 300 mg via ORAL

## 2016-07-20 MED ORDER — CEFOTETAN DISODIUM-DEXTROSE 2-2.08 GM-% IV SOLR
INTRAVENOUS | Status: AC
Start: 2016-07-20 — End: 2016-07-20
  Filled 2016-07-20: qty 50

## 2016-07-20 MED ORDER — EPHEDRINE SULFATE 50 MG/ML IJ SOLN
INTRAMUSCULAR | Status: DC | PRN
  Administered 2016-07-20: 10 mg via INTRAVENOUS

## 2016-07-20 MED ORDER — FENTANYL CITRATE (PF) 100 MCG/2ML IJ SOLN
25.0000 ug | INTRAMUSCULAR | Status: DC | PRN
  Administered 2016-07-20: 50 ug via INTRAVENOUS
  Administered 2016-07-20: 25 ug via INTRAVENOUS

## 2016-07-20 MED ORDER — 0.9 % SODIUM CHLORIDE (POUR BTL) OPTIME
TOPICAL | Status: DC | PRN
Start: 2016-07-20 — End: 2016-07-20
  Administered 2016-07-20: 1000 mL

## 2016-07-20 MED ORDER — IOPAMIDOL (ISOVUE-300) INJECTION 61%
INTRAVENOUS | Status: AC
  Filled 2016-07-20: qty 50

## 2016-07-20 MED ORDER — PROPOFOL 10 MG/ML IV BOLUS
INTRAVENOUS | Status: DC | PRN
  Administered 2016-07-20: 150 mg via INTRAVENOUS

## 2016-07-20 MED ORDER — LACTATED RINGERS IV SOLN
INTRAVENOUS | Status: DC | PRN
  Administered 2016-07-20 (×2): via INTRAVENOUS

## 2016-07-20 MED ORDER — GABAPENTIN 300 MG PO CAPS
ORAL_CAPSULE | ORAL | Status: AC
  Administered 2016-07-20: 300 mg via ORAL
  Filled 2016-07-20: qty 1

## 2016-07-20 MED ORDER — OXYCODONE HCL 5 MG PO TABS
ORAL_TABLET | ORAL | Status: AC
  Filled 2016-07-20: qty 1

## 2016-07-20 MED ORDER — FENTANYL CITRATE (PF) 250 MCG/5ML IJ SOLN
INTRAMUSCULAR | Status: AC
  Filled 2016-07-20: qty 5

## 2016-07-20 MED ORDER — ACETAMINOPHEN 500 MG PO TABS
ORAL_TABLET | ORAL | Status: AC
  Administered 2016-07-20: 1000 mg via ORAL
  Filled 2016-07-20: qty 2

## 2016-07-20 MED ORDER — PHENYLEPHRINE HCL 10 MG/ML IJ SOLN
INTRAMUSCULAR | Status: DC | PRN
  Administered 2016-07-20 (×2): 40 ug via INTRAVENOUS

## 2016-07-20 MED ORDER — OXYCODONE HCL 5 MG PO TABS
5.0000 mg | ORAL_TABLET | Freq: Once | ORAL | Status: AC | PRN
  Administered 2016-07-20: 5 mg via ORAL

## 2016-07-20 MED ORDER — ARTIFICIAL TEARS OPHTHALMIC OINT
TOPICAL_OINTMENT | OPHTHALMIC | Status: DC | PRN
  Administered 2016-07-20: 1 via OPHTHALMIC

## 2016-07-20 MED ORDER — PROPOFOL 10 MG/ML IV BOLUS
INTRAVENOUS | Status: AC
  Filled 2016-07-20: qty 40

## 2016-07-20 MED ORDER — ONDANSETRON HCL 4 MG/2ML IJ SOLN
4.0000 mg | Freq: Once | INTRAMUSCULAR | Status: AC | PRN
  Administered 2016-07-20: 4 mg via INTRAVENOUS

## 2016-07-20 MED ORDER — LIDOCAINE HCL (CARDIAC) 20 MG/ML IV SOLN
INTRAVENOUS | Status: DC | PRN
  Administered 2016-07-20: 60 mg via INTRAVENOUS

## 2016-07-20 MED ORDER — ONDANSETRON HCL 4 MG/2ML IJ SOLN
INTRAMUSCULAR | Status: DC | PRN
  Administered 2016-07-20: 4 mg via INTRAVENOUS

## 2016-07-20 MED ORDER — DEXAMETHASONE SODIUM PHOSPHATE 10 MG/ML IJ SOLN
INTRAMUSCULAR | Status: DC | PRN
  Administered 2016-07-20: 10 mg via INTRAVENOUS

## 2016-07-20 MED ORDER — CEFOTETAN DISODIUM-DEXTROSE 2-2.08 GM-% IV SOLR
2.0000 g | INTRAVENOUS | Status: AC
  Administered 2016-07-20: 2 g via INTRAVENOUS

## 2016-07-20 MED ORDER — SODIUM CHLORIDE 0.9 % IR SOLN
Status: DC | PRN
  Administered 2016-07-20: 1000 mL

## 2016-07-20 MED ORDER — ACETAMINOPHEN 500 MG PO TABS
1000.0000 mg | ORAL_TABLET | ORAL | Status: AC
  Administered 2016-07-20: 1000 mg via ORAL

## 2016-07-20 MED ORDER — FENTANYL CITRATE (PF) 100 MCG/2ML IJ SOLN
INTRAMUSCULAR | Status: AC
  Administered 2016-07-20: 25 ug
  Filled 2016-07-20: qty 2

## 2016-07-20 SURGICAL SUPPLY — 41 items
APPLIER CLIP 5 13 M/L LIGAMAX5 (MISCELLANEOUS) ×2
BLADE CLIPPER SURG (BLADE) IMPLANT
CANISTER SUCT 3000ML PPV (MISCELLANEOUS) ×2 IMPLANT
CHLORAPREP W/TINT 26ML (MISCELLANEOUS) ×2 IMPLANT
CLIP APPLIE 5 13 M/L LIGAMAX5 (MISCELLANEOUS) ×1 IMPLANT
CLSR STERI-STRIP ANTIMIC 1/2X4 (GAUZE/BANDAGES/DRESSINGS) ×2 IMPLANT
COVER MAYO STAND STRL (DRAPES) ×2 IMPLANT
COVER SURGICAL LIGHT HANDLE (MISCELLANEOUS) ×2 IMPLANT
DERMABOND ADVANCED (GAUZE/BANDAGES/DRESSINGS) ×1
DERMABOND ADVANCED .7 DNX12 (GAUZE/BANDAGES/DRESSINGS) ×1 IMPLANT
DRAPE C-ARM 42X72 X-RAY (DRAPES) ×2 IMPLANT
DRSG TEGADERM 2-3/8X2-3/4 SM (GAUZE/BANDAGES/DRESSINGS) ×2 IMPLANT
ELECT REM PT RETURN 9FT ADLT (ELECTROSURGICAL) ×2
ELECTRODE REM PT RTRN 9FT ADLT (ELECTROSURGICAL) ×1 IMPLANT
GLOVE BIOGEL PI IND STRL 8 (GLOVE) ×1 IMPLANT
GLOVE BIOGEL PI INDICATOR 8 (GLOVE) ×1
GLOVE ECLIPSE 7.5 STRL STRAW (GLOVE) ×2 IMPLANT
GOWN STRL REUS W/ TWL LRG LVL3 (GOWN DISPOSABLE) ×3 IMPLANT
GOWN STRL REUS W/TWL LRG LVL3 (GOWN DISPOSABLE) ×3
KIT BASIN OR (CUSTOM PROCEDURE TRAY) ×2 IMPLANT
KIT ROOM TURNOVER OR (KITS) ×2 IMPLANT
L-HOOK LAP DISP 36CM (ELECTROSURGICAL) ×2
LHOOK LAP DISP 36CM (ELECTROSURGICAL) ×1 IMPLANT
NS IRRIG 1000ML POUR BTL (IV SOLUTION) ×2 IMPLANT
PAD ARMBOARD 7.5X6 YLW CONV (MISCELLANEOUS) ×2 IMPLANT
PENCIL BUTTON HOLSTER BLD 10FT (ELECTRODE) ×2 IMPLANT
POUCH RETRIEVAL ECOSAC 10 (ENDOMECHANICALS) ×1 IMPLANT
POUCH RETRIEVAL ECOSAC 10MM (ENDOMECHANICALS) ×1
SCISSORS LAP 5X35 DISP (ENDOMECHANICALS) ×2 IMPLANT
SET CHOLANGIOGRAPH 5 50 .035 (SET/KITS/TRAYS/PACK) ×2 IMPLANT
SET IRRIG TUBING LAPAROSCOPIC (IRRIGATION / IRRIGATOR) ×2 IMPLANT
SLEEVE ENDOPATH XCEL 5M (ENDOMECHANICALS) ×4 IMPLANT
SPECIMEN JAR SMALL (MISCELLANEOUS) ×2 IMPLANT
STRIP CLOSURE SKIN 1/2X4 (GAUZE/BANDAGES/DRESSINGS) ×2 IMPLANT
SUT MNCRL AB 4-0 PS2 18 (SUTURE) ×2 IMPLANT
TOWEL OR 17X24 6PK STRL BLUE (TOWEL DISPOSABLE) ×2 IMPLANT
TOWEL OR 17X26 10 PK STRL BLUE (TOWEL DISPOSABLE) ×2 IMPLANT
TRAY LAPAROSCOPIC MC (CUSTOM PROCEDURE TRAY) ×2 IMPLANT
TROCAR XCEL BLUNT TIP 100MML (ENDOMECHANICALS) ×2 IMPLANT
TROCAR XCEL NON-BLD 5MMX100MML (ENDOMECHANICALS) ×2 IMPLANT
TUBING INSUFFLATION (TUBING) ×2 IMPLANT

## 2016-07-20 NOTE — Op Note (Addendum)
OPERATIVE REPORT  DATE OF OPERATION: 07/20/2016  PATIENT:  [name]  46 y.o. female  PRE-OPERATIVE DIAGNOSIS:  Chronic cholecystitis  POST-OPERATIVE DIAGNOSIS:  Chronic cholecystitis  INDICATION(S) FOR OPERATION:  Chronic RUQ pain and tenderness.  Symptomatic HIDA scan  FINDINGS:  Lots of RUQ, perihepatic and gallbladder adhesions.  Normal IOC.  PROCEDURE:  Procedure(s): LAPAROSCOPIC CHOLECYSTECTOMY WITH INTRAOPERATIVE CHOLANGIOGRAM  SURGEON:  Surgeon(s): Judeth Horn, MD  ASSISTANT: None  ANESTHESIA:   general  COMPLICATIONS:  None  EBL: 20 ml  BLOOD ADMINISTERED: none  DRAINS: none   SPECIMEN:  Source of Specimen:  Gallbladder and contents  COUNTS CORRECT:  YES  PROCEDURE DETAILS: The patient was taken to the operating room and placed on the table in the supine position.  After an adequate endotracheal anesthetic was administered, the patient was prepped with ChloroPrep, and then draped in the usual manner exposing the entire abdomen laterally, inferiorly and up  to the costal margins.  After a proper timeout was performed including identifying the patient and the procedure to be performed, a supraumbilical 9.8XQ midline incision was made using a #15 blade.  This was taken down to the fascia which was then incised with a #15 blade.  The edges of the fascia were tented up with Kocher clamps as the preperitoneal space was penetrated with a Kelly clamp into the peritoneum.  Once this was done, a pursestring suture of 0 Vicryl was passed around the fascial opening.  This was subsequently used to secure the [facility] cannula which was passed into the peritoneal cavity.  Once the [facility] cannula was in place, carbon dioxide gas was insufflated into the peritoneal cavity up to a maximal intra-abdominal pressure of 55mm Hg.The laparoscope, with attached camera and light source, was passed into the peritoneal cavity to visualize the direct insertion of two right upper quadrant  16mm cannulas, and a sup-xiphoid 37mm cannula.  Once all cannulas were in place, the dissection was begun.  The patient had adhesions in the RUQ and in the perihepatic region that required adhesiolysis prior to cholecystectomy.  This took approximately 30 minutes to perform safely.  Two ratcheted graspers were attached to the dome and infundibulum of the gallbladder and retracted towards the anterior abdominal wall and the right upper quadrant.  Using cautery attached to a dissecting forceps, the peritoneum overlaying the triangle of Chalot and the hepatoduodenal triangle was dissected away exposing the cystic duct and the cystic artery.  A critical window was developed between the CBD and the cystic duct The cystic artery was clipped proximally and distally then transected.  A clip was placed on the gallbladder side of the cystic duct, then a cholecystodochotomy made using the laparoscopic scissors.  Through the cholecystodochotomy a Cook catheter was passed to performed a cholangiogram.  The cholangiogram showed good flow into the duodenum, a long cystic duct, good proximal filling, no intraductal filling defects, and no dilatation.  Once the cholangiogram was completed, the [facility] catheter was removed, and the distal cystic duct was clipped multiple times then transected between the clips.  The cystic artery was controlled paralleling the cystic duct and separately clipped and transected.  The gallbladder was then dissected out of the hepatic bed without event.  It was retrieved from the abdomen using an EcoPouch without event.  Once the gallbladder was removed, the bed was inspected for hemostasis.  Once excellent hemostasis was obtained all gas and fluids were aspirated from above the liver, then the cannulas were removed.  The  supraumbilical incision was closed using the pursestring suture which was in place.  0.25% bupivicaine with epinephrine was injected at all sites.  All 64mm or greater cannula  sites were close using a running subcuticular stitch of 4-0 Monocryl.  5.52mm cannula sites were closed with Dermabond only.Steri-Strips and Tagaderm were used to complete the dressings at all sites.  At this point all needle, sponge, and instrument counts were correct.The patient was awakened from anesthesia and taken to the PACU in stable condition.      Kathryne Eriksson. Dahlia Bailiff, MD, [place] [phone] [facility]  PATIENT DISPOSITION:  PACU - hemodynamically stable.   Tushar Enns 5/21/20188:54 AM

## 2016-07-20 NOTE — Anesthesia Procedure Notes (Signed)
Procedure Name: Intubation Date/Time: 07/20/2016 7:36 AM Performed by: Suzy Bouchard Pre-anesthesia Checklist: Patient identified, Emergency Drugs available, Suction available, Patient being monitored and Timeout performed Patient Re-evaluated:Patient Re-evaluated prior to inductionOxygen Delivery Method: Circle system utilized Preoxygenation: Pre-oxygenation with 100% oxygen Intubation Type: IV induction Ventilation: Mask ventilation without difficulty Laryngoscope Size: Miller and 2 Grade View: Grade I Tube type: Oral Tube size: 7.0 mm Number of attempts: 1 Airway Equipment and Method: Stylet Placement Confirmation: ETT inserted through vocal cords under direct vision,  positive ETCO2 and breath sounds checked- equal and bilateral Secured at: 22 cm Tube secured with: Tape Dental Injury: Teeth and Oropharynx as per pre-operative assessment

## 2016-07-20 NOTE — Anesthesia Postprocedure Evaluation (Addendum)
Anesthesia Post Note  Patient: [name]  Procedure(s) Performed: Procedure(s) (LRB): LAPAROSCOPIC CHOLECYSTECTOMY WITH INTRAOPERATIVE CHOLANGIOGRAM (N/A)  Patient location during evaluation: PACU Anesthesia Type: General Level of consciousness: awake, awake and alert and oriented Pain management: pain level controlled Vital Signs Assessment: post-procedure vital signs reviewed and stable Respiratory status: spontaneous breathing, nonlabored ventilation and respiratory function stable Cardiovascular status: blood pressure returned to baseline Anesthetic complications: no       Last Vitals:  Vitals:   07/20/16 1245 07/20/16 1255  BP: 117/77 113/81  Pulse: 69 69  Resp: (!) 22 16  Temp: 36.1 C     Last Pain:  Vitals:   07/20/16 1215  TempSrc:   PainSc: Asleep                 [name]

## 2016-07-20 NOTE — Progress Notes (Signed)
Dr Linna Caprice took Scop patch off

## 2016-07-20 NOTE — Discharge Instructions (Addendum)
Laparoscopic Cholecystectomy, Care After °This sheet gives you information about how to care for yourself after your procedure. Your health care provider may also give you more specific instructions. If you have problems or questions, contact your health care provider. °What can I expect after the procedure? °After the procedure, it is common to have: °· Pain at your incision sites. You will be given medicines to control this pain. °· Mild nausea or vomiting. °· Bloating and possible shoulder pain from the air-like gas that was used during the procedure. °Follow these instructions at home: °Incision care  ° °· Follow instructions from your health care provider about how to take care of your incisions. Make sure you: °¨ Wash your hands with soap and water before you change your bandage (dressing). If soap and water are not available, use hand sanitizer. °¨ Change your dressing as told by your health care provider. °¨ Leave stitches (sutures), skin glue, or adhesive strips in place. These skin closures may need to be in place for 2 weeks or longer. If adhesive strip edges start to loosen and curl up, you may trim the loose edges. Do not remove adhesive strips completely unless your health care provider tells you to do that. °· Do not take baths, swim, or use a hot tub until your health care provider approves. Ask your health care provider if you can take showers. You may only be allowed to take sponge baths for bathing. °· Check your incision area every day for signs of infection. Check for: °¨ More redness, swelling, or pain. °¨ More fluid or blood. °¨ Warmth. °¨ Pus or a bad smell. °Activity  °· Do not drive or use heavy machinery while taking prescription pain medicine. °· Do not lift anything that is heavier than 10 lb (4.5 kg) until your health care provider approves. °· Do not play contact sports until your health care provider approves. °· Do not drive for 24 hours if you were given a medicine to help you relax  (sedative). °· Rest as needed. Do not return to work or school until your health care provider approves. °General instructions  °· Take over-the-counter and prescription medicines only as told by your health care provider. °· To prevent or treat constipation while you are taking prescription pain medicine, your health care provider may recommend that you: °¨ Drink enough fluid to keep your urine clear or pale yellow. °¨ Take over-the-counter or prescription medicines. °¨ Eat foods that are high in fiber, such as fresh fruits and vegetables, whole grains, and beans. °¨ Limit foods that are high in fat and processed sugars, such as fried and sweet foods. °Contact a health care provider if: °· You develop a rash. °· You have more redness, swelling, or pain around your incisions. °· You have more fluid or blood coming from your incisions. °· Your incisions feel warm to the touch. °· You have pus or a bad smell coming from your incisions. °· You have a fever. °· One or more of your incisions breaks open. °Get help right away if: °· You have trouble breathing. °· You have chest pain. °· You have increasing pain in your shoulders. °· You faint or feel dizzy when you stand. °· You have severe pain in your abdomen. °· You have nausea or vomiting that lasts for more than one day. °· You have leg pain. °This information is not intended to replace advice given to you by your health care provider. Make sure you discuss any   have with your health care provider. ° °James O. Wyatt, III, MD, FACS °[phone]--pager °[phone]--office °[facility] ° °

## 2016-07-20 NOTE — Progress Notes (Signed)
SPoke with pt, she is wanting to go home, but she states she is very dizzy, waiting to see if pt can go to procedurel side for extended stay

## 2016-07-20 NOTE — Transfer of Care (Signed)
Immediate Anesthesia Transfer of Care Note  Patient: [name]  Procedure(s) Performed: Procedure(s): LAPAROSCOPIC CHOLECYSTECTOMY WITH INTRAOPERATIVE CHOLANGIOGRAM (N/A)  Patient Location: PACU  Anesthesia Type:General  Level of Consciousness: sedated and responds to stimulation  Airway & Oxygen Therapy: Patient Spontanous Breathing and Patient connected to nasal cannula oxygen  Post-op Assessment: Report given to RN and Post -op Vital signs reviewed and stable  Post vital signs: Reviewed and stable  Last Vitals:  Vitals:   07/20/16 0556 07/20/16 0908  BP: 114/68   Pulse: 71   Resp: 20   Temp: 36.8 C (P) 36.4 C    Last Pain:  Vitals:   07/20/16 0556  TempSrc: Oral      Patients Stated Pain Goal: 8 (07/62/26 3335)  Complications: No apparent anesthesia complications

## 2016-07-20 NOTE — Anesthesia Preprocedure Evaluation (Signed)
Anesthesia Evaluation  Patient identified by MRN, date of birth, ID band Patient awake    Reviewed: Allergy & Precautions, NPO status , Patient's Chart, lab work & pertinent test results  Airway Mallampati: II  TM Distance: >3 FB Neck ROM: Full    Dental  (+) Teeth Intact, Dental Advisory Given   Pulmonary    breath sounds clear to auscultation       Cardiovascular  Rhythm:Regular Rate:Normal     Neuro/Psych    GI/Hepatic   Endo/Other    Renal/GU      Musculoskeletal   Abdominal   Peds  Hematology   Anesthesia Other Findings   Reproductive/Obstetrics                             Anesthesia Physical Anesthesia Plan  ASA: II  Anesthesia Plan: General   Post-op Pain Management:    Induction: Intravenous  Airway Management Planned: Oral ETT  Additional Equipment:   Intra-op Plan:   Post-operative Plan: Extubation in OR  Informed Consent: I have reviewed the patients History and Physical, chart, labs and discussed the procedure including the risks, benefits and alternatives for the proposed anesthesia with the patient or authorized representative who has indicated his/her understanding and acceptance.   Dental advisory given  Plan Discussed with: CRNA and Anesthesiologist  Anesthesia Plan Comments:         Anesthesia Quick Evaluation

## 2016-07-21 ENCOUNTER — Encounter (HOSPITAL_COMMUNITY): Payer: Self-pay | Admitting: General Surgery

## 2016-09-01 DIAGNOSIS — L710 Perioral dermatitis: Secondary | ICD-10-CM | POA: Diagnosis not present

## 2016-10-22 NOTE — Addendum Note (Signed)
Addendum  created 10/22/16 1451 by Anderia Lorenzo, MD   Sign clinical note    

## 2016-12-03 DIAGNOSIS — G43909 Migraine, unspecified, not intractable, without status migrainosus: Secondary | ICD-10-CM | POA: Diagnosis not present

## 2017-04-19 DIAGNOSIS — R030 Elevated blood-pressure reading, without diagnosis of hypertension: Secondary | ICD-10-CM | POA: Diagnosis not present

## 2017-04-19 DIAGNOSIS — G959 Disease of spinal cord, unspecified: Secondary | ICD-10-CM | POA: Diagnosis not present

## 2017-04-19 DIAGNOSIS — G5602 Carpal tunnel syndrome, left upper limb: Secondary | ICD-10-CM | POA: Diagnosis not present

## 2017-04-26 ENCOUNTER — Other Ambulatory Visit (HOSPITAL_COMMUNITY): Payer: Self-pay | Admitting: Neurosurgery

## 2017-04-26 DIAGNOSIS — G5602 Carpal tunnel syndrome, left upper limb: Secondary | ICD-10-CM

## 2017-04-30 ENCOUNTER — Other Ambulatory Visit (HOSPITAL_COMMUNITY): Payer: Self-pay | Admitting: Neurosurgery

## 2017-04-30 DIAGNOSIS — G959 Disease of spinal cord, unspecified: Secondary | ICD-10-CM

## 2017-05-01 ENCOUNTER — Other Ambulatory Visit (HOSPITAL_BASED_OUTPATIENT_CLINIC_OR_DEPARTMENT_OTHER): Payer: 59

## 2017-05-01 ENCOUNTER — Ambulatory Visit (HOSPITAL_BASED_OUTPATIENT_CLINIC_OR_DEPARTMENT_OTHER): Payer: 59

## 2017-05-03 ENCOUNTER — Ambulatory Visit (HOSPITAL_COMMUNITY): Payer: 59

## 2017-05-03 ENCOUNTER — Other Ambulatory Visit (HOSPITAL_COMMUNITY): Payer: Self-pay | Admitting: Neurosurgery

## 2017-05-03 ENCOUNTER — Ambulatory Visit (HOSPITAL_COMMUNITY)
Admission: RE | Admit: 2017-05-03 | Discharge: 2017-05-03 | Disposition: A | Discharge status: Home / Self Care | Payer: 59 | Source: Ambulatory Visit | Attending: Neurosurgery | Admitting: Neurosurgery

## 2017-05-03 ENCOUNTER — Inpatient Hospital Stay (HOSPITAL_COMMUNITY): Admission: RE | Admit: 2017-05-03 | Payer: 59 | Source: Ambulatory Visit

## 2017-05-03 DIAGNOSIS — G959 Disease of spinal cord, unspecified: Secondary | ICD-10-CM

## 2017-05-03 DIAGNOSIS — M50022 Cervical disc disorder at C5-C6 level with myelopathy: Secondary | ICD-10-CM | POA: Diagnosis not present

## 2017-05-03 DIAGNOSIS — M542 Cervicalgia: Secondary | ICD-10-CM | POA: Diagnosis not present

## 2017-05-04 DIAGNOSIS — Z124 Encounter for screening for malignant neoplasm of cervix: Secondary | ICD-10-CM | POA: Diagnosis not present

## 2017-05-04 DIAGNOSIS — Z01411 Encounter for gynecological examination (general) (routine) with abnormal findings: Secondary | ICD-10-CM | POA: Diagnosis not present

## 2017-05-04 DIAGNOSIS — Z6823 Body mass index (BMI) 23.0-23.9, adult: Secondary | ICD-10-CM | POA: Diagnosis not present

## 2017-05-05 DIAGNOSIS — Z1231 Encounter for screening mammogram for malignant neoplasm of breast: Secondary | ICD-10-CM | POA: Diagnosis not present

## 2017-05-31 DIAGNOSIS — G5602 Carpal tunnel syndrome, left upper limb: Secondary | ICD-10-CM | POA: Diagnosis not present

## 2017-06-22 DIAGNOSIS — E049 Nontoxic goiter, unspecified: Secondary | ICD-10-CM | POA: Diagnosis not present

## 2017-06-23 DIAGNOSIS — E049 Nontoxic goiter, unspecified: Secondary | ICD-10-CM | POA: Diagnosis not present

## 2017-07-15 DIAGNOSIS — G5602 Carpal tunnel syndrome, left upper limb: Secondary | ICD-10-CM | POA: Diagnosis not present

## 2017-07-15 DIAGNOSIS — R030 Elevated blood-pressure reading, without diagnosis of hypertension: Secondary | ICD-10-CM | POA: Diagnosis not present

## 2017-08-10 DIAGNOSIS — E049 Nontoxic goiter, unspecified: Secondary | ICD-10-CM | POA: Diagnosis not present

## 2017-10-05 DIAGNOSIS — E042 Nontoxic multinodular goiter: Secondary | ICD-10-CM | POA: Diagnosis not present

## 2017-10-05 DIAGNOSIS — E049 Nontoxic goiter, unspecified: Secondary | ICD-10-CM | POA: Diagnosis not present

## 2018-01-24 DIAGNOSIS — D649 Anemia, unspecified: Secondary | ICD-10-CM | POA: Diagnosis not present

## 2018-01-24 DIAGNOSIS — M25512 Pain in left shoulder: Secondary | ICD-10-CM | POA: Diagnosis not present

## 2018-01-24 DIAGNOSIS — E559 Vitamin D deficiency, unspecified: Secondary | ICD-10-CM | POA: Diagnosis not present

## 2018-01-24 DIAGNOSIS — G43909 Migraine, unspecified, not intractable, without status migrainosus: Secondary | ICD-10-CM | POA: Diagnosis not present

## 2018-01-24 DIAGNOSIS — Z79899 Other long term (current) drug therapy: Secondary | ICD-10-CM | POA: Diagnosis not present

## 2018-01-24 DIAGNOSIS — K5900 Constipation, unspecified: Secondary | ICD-10-CM | POA: Diagnosis not present

## 2018-01-24 DIAGNOSIS — Z0000 Encounter for general adult medical examination without abnormal findings: Secondary | ICD-10-CM | POA: Diagnosis not present

## 2018-01-24 DIAGNOSIS — E039 Hypothyroidism, unspecified: Secondary | ICD-10-CM | POA: Diagnosis not present

## 2018-02-04 DIAGNOSIS — D509 Iron deficiency anemia, unspecified: Secondary | ICD-10-CM | POA: Diagnosis not present

## 2018-02-04 DIAGNOSIS — E039 Hypothyroidism, unspecified: Secondary | ICD-10-CM | POA: Diagnosis not present

## 2018-02-04 DIAGNOSIS — Z0000 Encounter for general adult medical examination without abnormal findings: Secondary | ICD-10-CM | POA: Diagnosis not present

## 2018-02-04 DIAGNOSIS — E559 Vitamin D deficiency, unspecified: Secondary | ICD-10-CM | POA: Diagnosis not present

## 2018-02-04 DIAGNOSIS — Z13220 Encounter for screening for lipoid disorders: Secondary | ICD-10-CM | POA: Diagnosis not present

## 2018-02-04 DIAGNOSIS — Z79899 Other long term (current) drug therapy: Secondary | ICD-10-CM | POA: Diagnosis not present

## 2018-06-20 DIAGNOSIS — E049 Nontoxic goiter, unspecified: Secondary | ICD-10-CM | POA: Diagnosis not present

## 2018-06-27 DIAGNOSIS — R002 Palpitations: Secondary | ICD-10-CM | POA: Diagnosis not present

## 2018-06-27 DIAGNOSIS — E049 Nontoxic goiter, unspecified: Secondary | ICD-10-CM | POA: Diagnosis not present

## 2018-06-27 DIAGNOSIS — D649 Anemia, unspecified: Secondary | ICD-10-CM | POA: Diagnosis not present

## 2018-09-05 DIAGNOSIS — D649 Anemia, unspecified: Secondary | ICD-10-CM | POA: Diagnosis not present

## 2018-09-05 DIAGNOSIS — R002 Palpitations: Secondary | ICD-10-CM | POA: Diagnosis not present

## 2018-09-05 DIAGNOSIS — E049 Nontoxic goiter, unspecified: Secondary | ICD-10-CM | POA: Diagnosis not present

## 2018-11-30 ENCOUNTER — Other Ambulatory Visit (HOSPITAL_BASED_OUTPATIENT_CLINIC_OR_DEPARTMENT_OTHER): Payer: Self-pay | Admitting: Family Medicine

## 2018-11-30 DIAGNOSIS — Z1231 Encounter for screening mammogram for malignant neoplasm of breast: Secondary | ICD-10-CM

## 2018-12-01 ENCOUNTER — Ambulatory Visit (HOSPITAL_BASED_OUTPATIENT_CLINIC_OR_DEPARTMENT_OTHER)
Admission: RE | Admit: 2018-12-01 | Discharge: 2018-12-01 | Disposition: A | Discharge status: Home / Self Care | Payer: 59 | Source: Ambulatory Visit | Attending: Family Medicine | Admitting: Family Medicine

## 2018-12-01 ENCOUNTER — Other Ambulatory Visit: Payer: Self-pay

## 2018-12-01 DIAGNOSIS — Z1231 Encounter for screening mammogram for malignant neoplasm of breast: Secondary | ICD-10-CM | POA: Insufficient documentation

## 2018-12-06 DIAGNOSIS — K219 Gastro-esophageal reflux disease without esophagitis: Secondary | ICD-10-CM | POA: Diagnosis not present

## 2018-12-06 DIAGNOSIS — E039 Hypothyroidism, unspecified: Secondary | ICD-10-CM | POA: Diagnosis not present

## 2018-12-06 DIAGNOSIS — L659 Nonscarring hair loss, unspecified: Secondary | ICD-10-CM | POA: Diagnosis not present

## 2018-12-06 DIAGNOSIS — D509 Iron deficiency anemia, unspecified: Secondary | ICD-10-CM | POA: Diagnosis not present

## 2018-12-07 ENCOUNTER — Telehealth: Payer: Self-pay | Admitting: Neurology

## 2018-12-07 NOTE — Telephone Encounter (Signed)
This is a referral from Dr. Vertell Limber, would you call and offer her a few appointments next week:   October 13 11:30 Tuesday (preferred) Otherwise  Oct 15 Thursday we can make a 4pm for her.

## 2018-12-07 NOTE — Telephone Encounter (Signed)
I've called and left pt a message req a call bk to schedule. I've noted in the referral what appointments to offer.

## 2018-12-08 NOTE — Telephone Encounter (Signed)
Patient has been scheduled for Oct 13th at 11:30am

## 2018-12-08 NOTE — Telephone Encounter (Signed)
Thank you! Try her again today if you don't mind.

## 2018-12-13 ENCOUNTER — Other Ambulatory Visit: Payer: Self-pay

## 2018-12-13 ENCOUNTER — Ambulatory Visit: Payer: 59 | Admitting: Neurology

## 2018-12-13 ENCOUNTER — Telehealth: Payer: Self-pay | Admitting: Neurology

## 2018-12-13 ENCOUNTER — Encounter: Payer: Self-pay | Admitting: Neurology

## 2018-12-13 VITALS — BP 118/80 | HR 76 | Temp 97.8°F | Ht 60.0 in | Wt 120.0 lb

## 2018-12-13 DIAGNOSIS — M7918 Myalgia, other site: Secondary | ICD-10-CM

## 2018-12-13 DIAGNOSIS — G43709 Chronic migraine without aura, not intractable, without status migrainosus: Secondary | ICD-10-CM

## 2018-12-13 MED ORDER — AJOVY 225 MG/1.5ML ~~LOC~~ SOAJ: 225.0000 mg | SUBCUTANEOUS | 11 refills | Status: DC

## 2018-12-13 NOTE — Patient Instructions (Signed)
  Fremanezumab injection What is this medicine? FREMANEZUMAB (fre ma NEZ ue mab) is used to prevent migraine headaches. This medicine may be used for other purposes; ask your health care provider or pharmacist if you have questions. COMMON BRAND NAME(S): AJOVY What should I tell my health care provider before I take this medicine? They need to know if you have any of these conditions:  an unusual or allergic reaction to fremanezumab, other medicines, foods, dyes, or preservatives  pregnant or trying to get pregnant  breast-feeding How should I use this medicine? This medicine is for injection under the skin. You will be taught how to prepare and give this medicine. Use exactly as directed. Take your medicine at regular intervals. Do not take your medicine more often than directed. It is important that you put your used needles and syringes in a special sharps container. Do not put them in a trash can. If you do not have a sharps container, call your pharmacist or healthcare provider to get one. Talk to your pediatrician regarding the use of this medicine in children. Special care may be needed. Overdosage: If you think you have taken too much of this medicine contact a poison control center or emergency room at once. NOTE: This medicine is only for you. Do not share this medicine with others. What if I miss a dose? If you miss a dose, take it as soon as you can. If it is almost time for your next dose, take only that dose. Do not take double or extra doses. What may interact with this medicine? Interactions are not expected. This list may not describe all possible interactions. Give your health care provider a list of all the medicines, herbs, non-prescription drugs, or dietary supplements you use. Also tell them if you smoke, drink alcohol, or use illegal drugs. Some items may interact with your medicine. What should I watch for while using this medicine? Tell your doctor or healthcare  professional if your symptoms do not start to get better or if they get worse. What side effects may I notice from receiving this medicine? Side effects that you should report to your doctor or health care professional as soon as possible:  allergic reactions like skin rash, itching or hives, swelling of the face, lips, or tongue Side effects that usually do not require medical attention (report these to your doctor or health care professional if they continue or are bothersome):  pain, redness, or irritation at site where injected This list may not describe all possible side effects. Call your doctor for medical advice about side effects. You may report side effects to FDA at 1-800-FDA-1088. Where should I keep my medicine? Keep out of the reach of children. You will be instructed on how to store this medicine. Throw away any unused medicine after the expiration date on the label. NOTE: This sheet is a summary. It may not cover all possible information. If you have questions about this medicine, talk to your doctor, pharmacist, or health care provider.  2020 Elsevier/Gold Standard (2016-11-16 17:22:56)  

## 2018-12-13 NOTE — Telephone Encounter (Signed)
Please initiate botox and call atient to schedule. She would like first available botox, her choice when to schedule but she may not want to wait

## 2018-12-13 NOTE — Telephone Encounter (Signed)
Patient consent to be obtained at first injection visit. I have the Botox order form ready for Dr. Cathren Laine signature. Dx code: G42.709.

## 2018-12-13 NOTE — Progress Notes (Signed)
[id number]    Provider:  Dr Jaynee Eagles Requesting Provider: Erline Levine, MD  Primary Care Provider:  Jonathon Jordan, MD  CC:  Migraine  HPI:  [name] is a 48 y.o. female here as requested by Jonathon Jordan, MD for migraines. She has had migraines since HS, was diagnosed by Dr. Jacelyn Grip when she moved here. She has tried amitriptyline, neurontin, topamax, metoprolol. She is not taking anything because she has had a lot of side effects from these medications. She does want nerve blocks. On the right, starts in the eye and spreads to the whole side, no aura, 24-72 hours, maxalt helps, some ibuprofen and tylenol but no medication overuse. The headache pounding/pulsating/throbbing, nausea, photo/phonophobia, heat trigers, movement makes it worse, stress triggers, maxalt sometimes helps. No inciting events or head trauma. Sleeping and a dark room helps. 15 headache days a month. 8-10 migraine days that are moderately severe or severe, signifiantly impacting life. No other focal neurologic deficits, associated symptoms, inciting events or modifiable factors.  Reviewed notes, labs and imaging from outside physicians, which showed:  MRI of the brain 09/30/2015: showed No acute intracranial abnormalities including mass lesion or mass effect, hydrocephalus, extra-axial fluid collection, midline shift, hemorrhage, or acute infarction, large ischemic events (personally reviewed images)  CMP, cbc unremarkable  Review of Systems: Patient complains of symptoms per HPI as well as the following symptoms: migraine. Pertinent negatives and positives per HPI. All others negative.   Social History   Socioeconomic History  . Marital status: Divorced    Spouse name: Not on file  . Number of children: 1  . Years of education: Not on file  . Highest education level: Bachelor's degree (e.g., BA, AB, BS)  Occupational History  . Not on file  Social Needs  . Financial resource strain:  Not on file  . Food insecurity    Worry: Not on file    Inability: Not on file  . Transportation needs    Medical: Not on file    Non-medical: Not on file  Tobacco Use  . Smoking status: Never Smoker  . Smokeless tobacco: Never Used  Substance and Sexual Activity  . Alcohol use: No    Alcohol/week: 0.0 standard drinks  . Drug use: No  . Sexual activity: Yes    Birth control/protection: None  Lifestyle  . Physical activity    Days per week: Not on file    Minutes per session: Not on file  . Stress: Not on file  Relationships  . Social Herbalist on phone: Not on file    Gets together: Not on file    Attends religious service: Not on file    Active member of club or organization: Not on file    Attends meetings of clubs or organizations: Not on file    Relationship status: Not on file  . Intimate partner violence    Fear of current or ex partner: Not on file    Emotionally abused: Not on file    Physically abused: Not on file    Forced sexual activity: Not on file  Other Topics Concern  . Not on file  Social History Narrative   Lives with son in two story home.  Has one child.     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     Education: bachelors   Caffeine: at least 2 cups of coffee/day   Right handed    Family History  Problem Relation  Age of Onset  . Hypertension Mother        Living  . Diabetes Mother   . Asthma Father        Deceased, 62  . Migraines Sister   . Healthy Brother   . Healthy Sister   . Asthma Son     Past Medical History:  Diagnosis Date  . Anemia    hx  . Chicken pox   . Esophagitis   . Fibrocystic breast changes 05/29/2009  . GERD (gastroesophageal reflux disease)    hx  . Goiter   . Hypothyroidism   . Measles   . Migraines   . Pulmonary congestion    postpartum  . Thyroid disease   . Varicose veins     Patient Active Problem List   Diagnosis Date Noted  . Chronic migraine without aura without status  migrainosus, not intractable 12/13/2018  . Paresthesias in left hand 07/04/2014  . Menorrhagia 05/02/2014  . Uterine polyp 05/02/2014  . Anemia 06/30/2011  . Abdominal pain 06/30/2011  . Hypothyroidism   . Fibrocystic breast changes 05/11/2011  . Leg pain 05/11/2011  . Hyperthyroidism 05/11/2011    Past Surgical History:  Procedure Laterality Date  . APPENDECTOMY  01/01/2003  . CESAREAN SECTION  01/04/2004  . CHOLECYSTECTOMY N/A 07/20/2016   Procedure: LAPAROSCOPIC CHOLECYSTECTOMY WITH INTRAOPERATIVE CHOLANGIOGRAM;  Surgeon: Judeth Horn, MD;  Location: [facility];  Service: General;  Laterality: N/A;  . DILATATION & CURRETTAGE/HYSTEROSCOPY WITH RESECTOCOPE  05/02/2014   Procedure: [place];  Surgeon: [name], MD;  Location: [facility];  Service: Gynecology;;    Current Outpatient Medications  Medication Sig Dispense Refill  . eletriptan (RELPAX) 40 MG tablet Take 40 mg by mouth as needed for migraine or headache. One tablet by mouth at onset of headache. May repeat in 2 hours if headache persists or recurs.    . Fe Fum-FePoly-Vit C-Vit B3 (INTEGRA) 62.5-62.5-40-3 MG CAPS 1 CAPSULE BETWEEN MEALS ONCE A DAY ORALLY 30 DAY(S)    . levothyroxine (SYNTHROID, LEVOTHROID) 25 MCG tablet Take 25 mcg by mouth daily before breakfast.  12  . rizatriptan (MAXALT) 10 MG tablet as needed.     . Fremanezumab-vfrm (AJOVY) 225 MG/1.5ML SOAJ Inject 225 mg into the skin every 30 (thirty) days. 2 pen 11  . promethazine (PHENERGAN) 12.5 MG tablet Take 1 tablet (12.5 mg total) by mouth every 6 (six) hours as needed for nausea. (Patient not taking: Reported on 12/13/2018) 20 tablet 3   No current facility-administered medications for this visit.     Allergies as of 12/13/2018 - Review Complete 12/13/2018  Allergen Reaction Noted  . No known allergies  07/17/2016    Vitals: BP 118/80 (BP Location: Left Arm, Patient Position: Sitting)   Pulse 76   Temp 97.8 F  (36.6 C) Comment: taken at front door  Ht 5' (1.524 m)   Wt 120 lb (54.4 kg)   BMI 23.44 kg/m  Last Weight:  Wt Readings from Last 1 Encounters:  12/13/18 120 lb (54.4 kg)   Last Height:   Ht Readings from Last 1 Encounters:  12/13/18 5' (1.524 m)     Physical exam: Exam: Gen: NAD, conversant, well nourised, well groomed                     CV: RRR, no MRG. No Carotid Bruits. No peripheral edema, warm, nontender Eyes: Conjunctivae clear without exudates or hemorrhage  Neuro: Detailed Neurologic Exam  Speech:  Speech is normal; fluent and spontaneous with normal comprehension.  Cognition:    The patient is oriented to person, place, and time;     recent and remote memory intact;     language fluent;     normal attention, concentration,     fund of knowledge Cranial Nerves:    The pupils are equal, round, and reactive to light. The fundi are normal and spontaneous venous pulsations are present. Visual fields are full to finger confrontation. Extraocular movements are intact. Trigeminal sensation is intact and the muscles of mastication are normal. The face is symmetric. The palate elevates in the midline. Hearing intact. Voice is normal. Shoulder shrug is normal. The tongue has normal motion without fasciculations.   Coordination:    Normal finger to nose and heel to shin. Normal rapid alternating movements.   Gait:    Heel-toe and tandem gait are normal.   Motor Observation:    No asymmetry, no atrophy, and no involuntary movements noted. Tone:    Normal muscle tone.    Posture:    Posture is normal. normal erect    Strength:    Strength is V/V in the upper and lower limbs.      Sensation: intact to LT     Reflex Exam:  DTR's:    Deep tendon reflexes in the upper and lower extremities are normal bilaterally.   Toes:    The toes are downgoing bilaterally.   Clonus:    Clonus is absent.    Assessment/Plan:  Patient with chronic migraines  [place]:  Dry Needling Start Ajovy, also schedule botox Dry needling for cervical myofascial pain syndrome Continue maxalt acutely  Meds ordered this encounter  Medications  . Fremanezumab-vfrm (AJOVY) 225 MG/1.5ML SOAJ    Sig: Inject 225 mg into the skin every 30 (thirty) days.    Dispense:  2 pen    Refill:  11   Orders Placed This Encounter  Procedures  . Ambulatory referral to Physical Therapy    Discussed: To prevent or relieve headaches, try the following: Cool Compress. Lie down and place a cool compress on your head.  Avoid headache triggers. If certain foods or odors seem to have triggered your migraines in the past, avoid them. A headache diary might help you identify triggers.  Include physical activity in your daily routine. Try a daily walk or other moderate aerobic exercise.  Manage stress. Find healthy ways to cope with the stressors, such as delegating tasks on your to-do list.  Practice relaxation techniques. Try deep breathing, yoga, massage and visualization.  Eat regularly. Eating regularly scheduled meals and maintaining a healthy diet might help prevent headaches. Also, drink plenty of fluids.  Follow a regular sleep schedule. Sleep deprivation might contribute to headaches Consider biofeedback. With this mind-body technique, you learn to control certain bodily functions - such as muscle tension, heart rate and blood pressure - to prevent headaches or reduce headache pain.    Proceed to emergency room if you experience new or worsening symptoms or symptoms do not resolve, if you have new neurologic symptoms or if headache is severe, or for any concerning symptom.   Provided education and documentation from American headache Society toolbox including articles on: chronic migraine medication overuse headache, chronic migraines, prevention of migraines, behavioral and other nonpharmacologic treatments for headache.   Orders Placed This Encounter  Procedures  . Ambulatory  referral to Physical Therapy   Meds ordered this encounter  Medications  . Fremanezumab-vfrm (AJOVY)  225 MG/1.5ML SOAJ    Sig: Inject 225 mg into the skin every 30 (thirty) days.    Dispense:  2 pen    Refill:  11    Cc: Jonathon Jordan, MD,  Charlyne Petrin, [facility] [address] [place], [place] [address]  Phone [phone] Fax [phone]

## 2018-12-14 NOTE — Telephone Encounter (Signed)
Botox order charge sheet signed and given to botox coordinator.

## 2018-12-20 ENCOUNTER — Other Ambulatory Visit: Payer: Self-pay

## 2018-12-20 ENCOUNTER — Ambulatory Visit: Payer: 59 | Attending: Neurology | Admitting: Physical Therapy

## 2018-12-20 ENCOUNTER — Encounter: Payer: Self-pay | Admitting: Physical Therapy

## 2018-12-20 DIAGNOSIS — R519 Headache, unspecified: Secondary | ICD-10-CM | POA: Insufficient documentation

## 2018-12-20 DIAGNOSIS — G8929 Other chronic pain: Secondary | ICD-10-CM | POA: Insufficient documentation

## 2018-12-20 DIAGNOSIS — M542 Cervicalgia: Secondary | ICD-10-CM | POA: Insufficient documentation

## 2018-12-20 DIAGNOSIS — R29818 Other symptoms and signs involving the nervous system: Secondary | ICD-10-CM | POA: Diagnosis not present

## 2018-12-20 NOTE — Therapy (Signed)
[facility] [address]  [place], [place], [address] Phone: [phone]   Fax:  [phone]  Physical Therapy Evaluation  Patient Details  Name: [name] MRN: [id number] Date of Birth: 1971/02/10 Referring Provider (PT): Sarina Ill, MD   Encounter Date: 12/20/2018  PT End of Session - 12/20/18 0837    Visit Number  1    Number of Visits  12    Date for PT Re-Evaluation  01/31/19    Authorization Type  [facility]    PT Start Time  0837    PT Stop Time  0943    PT Time Calculation (min)  66 min    Activity Tolerance  Patient tolerated treatment well    Behavior During Therapy  [facility] for tasks assessed/performed       Past Medical History:  Diagnosis Date  . Anemia    hx  . Chicken pox   . Esophagitis   . Fibrocystic breast changes 05/29/2009  . GERD (gastroesophageal reflux disease)    hx  . Goiter   . Hypothyroidism   . Measles   . Migraines   . Pulmonary congestion    postpartum  . Thyroid disease   . Varicose veins     Past Surgical History:  Procedure Laterality Date  . APPENDECTOMY  01/01/2003  . CESAREAN SECTION  01/04/2004  . CHOLECYSTECTOMY N/A 07/20/2016   Procedure: LAPAROSCOPIC CHOLECYSTECTOMY WITH INTRAOPERATIVE CHOLANGIOGRAM;  Surgeon: Judeth Horn, MD;  Location: [facility];  Service: General;  Laterality: N/A;  . DILATATION & CURRETTAGE/HYSTEROSCOPY WITH RESECTOCOPE  05/02/2014   Procedure: [place];  Surgeon: [name], MD;  Location: [facility];  Service: Gynecology;;    There were no vitals filed for this visit.   Subjective Assessment - 12/20/18 0840    Subjective  Pt reporting h/o migraines since she was a teenager. Reports 8-10 migraine days per month with 10-15 light headache days/month. Migraines usually on R starting with throbbing in R eyebrow and spreading down R side of face and head with numbness. Denies aura. Migraines will  sometimes last for days. Also reports pain in neck just below hairline when migraines present. Also being monitored by neurosurgeon (Dr. Vertell Limber) for neck pain and L UE weakness and numbness.    Patient is accompained by:  --   boyfriend   Pertinent History  migraines; neck pain with L UE weakness & parasthesias; L shoulder pain/LOM;  hypothyroidism    Limitations  House hold activities    Diagnostic tests  05/03/17 - cervical MRI: 1. Small central disc protrusion at C5-6 without significant stenosis. 2. Mild uncovertebral disease at C4-5 and C6-7 without significant neural foraminal encroachment.    Patient Stated Goals  "To have less frequent migraines."    Currently in Pain?  Yes    Pain Score  0-No pain   10/10 at worst   Pain Location  Head    Pain Orientation  Right    Pain Descriptors / Indicators  Headache;Throbbing    Pain Type  Chronic pain    Pain Frequency  Intermittent    Aggravating Factors   lack of sleep, sudden temp changes, bright light, stress, menstrual cycle (starting 3 days prior to cycle and extending through cycle)    Pain Relieving Factors  meds, sleep in dark room    Effect of Pain on Daily Activities  unable to do anything when she has a migraine  [facility] PT Assessment - 12/20/18 0837      Assessment   Medical Diagnosis  Migraines, cervical myofascial pain syndrome    Referring Provider (PT)  Sarina Ill, MD    Onset Date/Surgical Date  --   chronic   Hand Dominance  Right    Next MD Visit  ~6 weeks - not yet scheduled    Prior Therapy  none      Precautions   Precautions  None      Balance Screen   Has the patient fallen in the past 6 months  No    Has the patient had a decrease in activity level because of a fear of falling?   No    Is the patient reluctant to leave their home because of a fear of falling?   No      Home Film/video editor residence      Prior Function   Level of Independence  Independent    Vocation   Full time employment    Loss adjuster, chartered - neuro/spine     Leisure  walk 2-3x/wk ~2 miles; watch TV      Cognition   Overall Cognitive Status  Within Functional Limits for tasks assessed      Posture/Postural Control   Posture/Postural Control  Postural limitations    Postural Limitations  Forward head;Rounded Shoulders   mild, R>L     ROM / Strength   AROM / PROM / Strength  AROM;Strength      AROM   Overall AROM Comments  Cervical ROM WFL with no pain reported; pain with all motions of L shoulder except FER    AROM Assessment Site  Cervical;Shoulder    Right/Left Shoulder  Right;Left    Right Shoulder Flexion  148 Degrees    Right Shoulder ABduction  165 Degrees    Right Shoulder Internal Rotation  --   FIR WFL   Right Shoulder External Rotation  --   FER WFL   Left Shoulder Flexion  140 Degrees    Left Shoulder ABduction  145 Degrees    Left Shoulder Internal Rotation  --   FIR WFL - pain   Left Shoulder External Rotation  --   FER WFL     Strength   Strength Assessment Site  Shoulder;Hand    Right/Left Shoulder  Right;Left    Right Shoulder Flexion  4+/5    Right Shoulder ABduction  4+/5    Right Shoulder Internal Rotation  4+/5    Right Shoulder External Rotation  4+/5    Left Shoulder Flexion  4/5    Left Shoulder ABduction  4-/5    Left Shoulder Internal Rotation  4+/5    Left Shoulder External Rotation  4/5    Right/Left hand  Right;Left    Right Hand Gross Grasp  Functional    Right Hand Grip (lbs)  32   31, 31, 34   Right Hand Lateral Pinch  11 lbs   12, 11, 10   Right Hand 3 Point Pinch  8.66 lbs   9, 8, 9   Left Hand Gross Grasp  Functional    Left Hand Grip (lbs)  26   27, 26, 25   Left Hand Lateral Pinch  9.33 lbs   10, 9, 9   Left Hand 3 Point Pinch  8.66 lbs   9, 9, 8     Palpation   Palpation comment  increased muscle tension  in B cervical paraspinals, suboccipitals and temporalis (R>L), R UT and pecs with ttp over R UT                 Objective measurements completed on examination: See above findings.      [facility] Adult PT Treatment/Exercise - 12/20/18 0837      Self-Care   Self-Care  Posture    Posture  Provided initial instruction in neutral neck/spine and shoulder posture.      Exercises   Exercises  Neck      Neck Exercises: Seated   Neck Retraction  10 reps;5 secs    Other Seated Exercise  Scap retraction + depression 10 x 5"      Manual Therapy   Manual Therapy  Soft tissue mobilization;Myofascial release;Other (comment)    Manual therapy comments  supine    Soft tissue mobilization  STM/DTM to R UT    Myofascial Release  manual TPR to R UT    Other Manual Therapy  Instruction in use of Theracane for self-STM/TPR to UT      Neck Exercises: Stretches   Upper Trapezius Stretch  Right;30 seconds;1 rep    Corner Stretch  30 seconds;3 reps    Corner Stretch Limitations  3-way doorway stretch             PT Education - 12/20/18 [phone]    Education Details  PT eval findings, anticipated POC including role of DN and expected response to treatment, initial postural education and HEP    Person(s) Educated  Patient;Other (comment)   boyfriend   Methods  Explanation;Demonstration;Handout    Comprehension  Verbalized understanding;Returned demonstration;Need further instruction       PT Short Term Goals - 12/20/18 0943      PT SHORT TERM GOAL #1   Title  Patient will be independent with initial HEP    Status  New    Target Date  01/03/19      PT SHORT TERM GOAL #2   Title  Patient will verbalize/demonstrate understanding of neutral spine posture and proper body mechanics to reduce myofascial strain    Status  New    Target Date  01/10/19        PT Long Term Goals - 12/20/18 0943      PT LONG TERM GOAL #1   Title  Patient will be independent with ongoing/advanced HEP    Status  New    Target Date  01/31/19      PT LONG TERM GOAL #2   Title  Patient to demonstrate  appropriate posture and body mechanics needed for daily activities    Status  New    Target Date  01/31/19      PT LONG TERM GOAL #3   Title  Patient to demonstrate improved cervical muscle tissue quality and pliability with reduced pain    Status  New    Target Date  01/31/19      PT LONG TERM GOAL #4   Title  Patient to report reduction in frequency and intensity of migraines by >/= 50%    Status  New    Target Date  01/31/19             Plan - 12/20/18 0943    Clinical Impression Statement  Chavela is a 48 y/o female referred to OP PT for DN for migraines and cervical myofascial pain syndrome. She reports 8-10 migraine days per month with 10-15 light headache days/month, with greatest  frequency and intensity associated with menstrual cycle. Migraines are usually on R side of head starting with throbbing in R eyebrow and spreading down R side of face and head with associated numbness. She denies aura but reports pain in neck just below hairline when migraines present. Cervical ROM WNL but increased muscle tension evident in R>L cervical paraspinals, suboccipitals, UT and pecs which is likely aggravated by forward head and shoulder posture. L shoulder ROM mildly limited in flexion and abduction by pain which she has been told is likely RTC impingement/tendinitis. She also reports intermittent L UE numbness and mild L grip weakness for which she is being monitored by neurosurgery for cervical spine degenerative changes vs peripheral nerve issues. Sharah will benefit from skilled PT to address above deficits and current functional limitations as well as to decrease pain interference with daily activities by reducing frequency and intensity of migraines.    Personal Factors and Comorbidities  Comorbidity 3+;Past/Current Experience;Time since onset of injury/illness/exacerbation    Comorbidities  migraines; neck pain with L UE weakness & parasthesias; L shoulder pain/LOM;  hypothyroidism     Examination-Activity Limitations  Sleep;Other   all activities impacted when migraine present   Examination-Participation Restrictions  Other   has needed to have intermittent FMLA due to migraines - not currently on intermittent FMLA   Stability/Clinical Decision Making  Unstable/Unpredictable    Clinical Decision Making  High    Rehab Potential  Good    PT Frequency  2x / week    PT Duration  6 weeks    PT Treatment/Interventions  ADLs/Self Care Home Management;Electrical Stimulation;Iontophoresis 4mg /ml Dexamethasone;Moist Heat;Traction;Ultrasound;Therapeutic activities;Therapeutic exercise;Neuromuscular re-education;Patient/family education;Manual techniques;Passive range of motion;Dry needling;Taping;Spinal Manipulations    PT Next Visit Plan  Review initial HEP; manual therapy including DN as indicated to R UT, temporalis, cervical paraspinals & suboccipitals; postural education; postural stretching and strengthening; modalities PRN    PT Home Exercise Plan  12/20/18 - neutral neck/spine & shoulder posture; HEP - cervical retraction, UT stretch, 3-way doorway stretch, scap retraction    Consulted and Agree with Plan of Care  Patient;Other (Comment)   boyfriend      Patient will benefit from skilled therapeutic intervention in order to improve the following deficits and impairments:  Pain, Increased muscle spasms, Increased fascial restricitons, Postural dysfunction, Improper body mechanics, Impaired UE functional use, Decreased strength, Decreased range of motion, Decreased knowledge of precautions, Decreased activity tolerance  Visit Diagnosis: Chronic nonintractable headache, unspecified headache type  Cervicalgia  Other symptoms and signs involving the nervous system     Problem List Patient Active Problem List   Diagnosis Date Noted  . Chronic migraine without aura without status migrainosus, not intractable 12/13/2018  . Paresthesias in left hand 07/04/2014  . Menorrhagia  05/02/2014  . Uterine polyp 05/02/2014  . Anemia 06/30/2011  . Abdominal pain 06/30/2011  . Hypothyroidism   . Fibrocystic breast changes 05/11/2011  . Leg pain 05/11/2011  . Hyperthyroidism 05/11/2011    Percival Spanish, PT, MPT 12/20/2018, 12:17 PM  [facility] [address]  [place], [place], [address] Phone: [phone]   Fax:  [phone]  Name: [name] MRN: [id number] Date of Birth: 08-05-1970

## 2018-12-20 NOTE — Patient Instructions (Addendum)

## 2018-12-21 NOTE — Telephone Encounter (Signed)
I called and scheduled the patient. DW  °

## 2018-12-26 ENCOUNTER — Ambulatory Visit: Payer: 59 | Admitting: Physical Therapy

## 2018-12-30 ENCOUNTER — Ambulatory Visit: Payer: 59 | Admitting: Physical Therapy

## 2018-12-30 ENCOUNTER — Encounter: Payer: Self-pay | Admitting: Physical Therapy

## 2018-12-30 ENCOUNTER — Other Ambulatory Visit: Payer: Self-pay

## 2018-12-30 DIAGNOSIS — R519 Headache, unspecified: Secondary | ICD-10-CM | POA: Diagnosis not present

## 2018-12-30 DIAGNOSIS — M542 Cervicalgia: Secondary | ICD-10-CM | POA: Diagnosis not present

## 2018-12-30 DIAGNOSIS — G8929 Other chronic pain: Secondary | ICD-10-CM

## 2018-12-30 DIAGNOSIS — R29818 Other symptoms and signs involving the nervous system: Secondary | ICD-10-CM

## 2018-12-30 NOTE — Therapy (Signed)
[facility] [address]  [place], [place], [address] Phone: [phone]   Fax:  [phone]  Physical Therapy Treatment  Patient Details  Name: [name] MRN: [id number] Date of Birth: Jul 08, 1970 Referring Provider (PT): Sarina Ill, MD   Encounter Date: 12/30/2018  PT End of Session - 12/30/18 0931    Visit Number  2    Number of Visits  12    Date for PT Re-Evaluation  01/31/19    Authorization Type  [facility]    PT Start Time  0931    PT Stop Time  1036    PT Time Calculation (min)  65 min    Activity Tolerance  Patient tolerated treatment well    Behavior During Therapy  [facility] for tasks assessed/performed       Past Medical History:  Diagnosis Date  . Anemia    hx  . Chicken pox   . Esophagitis   . Fibrocystic breast changes 05/29/2009  . GERD (gastroesophageal reflux disease)    hx  . Goiter   . Hypothyroidism   . Measles   . Migraines   . Pulmonary congestion    postpartum  . Thyroid disease   . Varicose veins     Past Surgical History:  Procedure Laterality Date  . APPENDECTOMY  01/01/2003  . CESAREAN SECTION  01/04/2004  . CHOLECYSTECTOMY N/A 07/20/2016   Procedure: LAPAROSCOPIC CHOLECYSTECTOMY WITH INTRAOPERATIVE CHOLANGIOGRAM;  Surgeon: Judeth Horn, MD;  Location: [facility];  Service: General;  Laterality: N/A;  . DILATATION & CURRETTAGE/HYSTEROSCOPY WITH RESECTOCOPE  05/02/2014   Procedure: [place];  Surgeon: [name], MD;  Location: [facility];  Service: Gynecology;;    There were no vitals filed for this visit.  Subjective Assessment - 12/30/18 0934    Subjective  Pt noting increased soreness in L shoulder upon waking in the morning after laying on L side.    Patient is accompained by:  --   boyfriend   Pertinent History  migraines; neck pain with L UE weakness & parasthesias; L shoulder pain/LOM;  hypothyroidism    Limitations   House hold activities    Diagnostic tests  05/03/17 - cervical MRI: 1. Small central disc protrusion at C5-6 without significant stenosis. 2. Mild uncovertebral disease at C4-5 and C6-7 without significant neural foraminal encroachment.    Patient Stated Goals  "To have less frequent migraines."    Currently in Pain?  Yes    Pain Score  0-No pain    Pain Location  Head    Pain Score  4    Pain Location  Shoulder    Pain Orientation  Left    Pain Descriptors / Indicators  Sore    Pain Type  Acute pain;Chronic pain    Pain Frequency  Intermittent    Aggravating Factors   sleeping on either side                       [facility] Adult PT Treatment/Exercise - 12/30/18 0931      Exercises   Exercises  Neck      Neck Exercises: Machines for Strengthening   UBE (Upper Arm Bike)  L1.5 x 6 min (3' fwd/3' back)      Neck Exercises: Theraband   Rows  10 reps   yellow TB   Rows Limitations  cues for scap retraction, avoiding shoulder elevation    Horizontal ABduction  10 reps   yellow   Horizontal ABduction Limitations  hooklying with cues for scap retraction    Other Theraband Exercises  Alt UE diagonals with yellow TB x 10; hooklying      Neck Exercises: Seated   Neck Retraction  10 reps;5 secs    Neck Retraction Limitations  cues for neutral head tilt    Other Seated Exercise  Scap retraction + depression 10 x 5" - cues to avoid shoulder hike      Modalities   Modalities  Electrical Stimulation;Moist Heat      Moist Heat Therapy   Number Minutes Moist Heat  15 Minutes    Moist Heat Location  Shoulder   Left     Electrical Stimulation   Electrical Stimulation Location  L upper shoulder    Electrical Stimulation Action  IFC    Electrical Stimulation Parameters  80-150 Hz, intensity to pt tolerance x 15'    Electrical Stimulation Goals  Pain;Tone      Manual Therapy   Manual Therapy  Soft tissue mobilization;Myofascial release;Passive ROM;Manual Traction    Soft  tissue mobilization  STM/DTM to B UT, cervical paraspinals and suboccipitals    Myofascial Release  manual TPR to L UT, gentle suboccipital release    Passive ROM  gentle cervical PROM all directions with manual UT & lateral neck muscular stretching    Manual Traction  gentle cervical distraction 3 x 30 sec      Neck Exercises: Stretches   Upper Trapezius Stretch  Left;Right;30 seconds;1 rep       Trigger Point Dry Needling - 12/30/18 0931    Consent Given?  Yes    Education Handout Provided  Previously provided    Muscles Treated Head and Neck  Upper trapezius    Upper Trapezius Response  Twitch reponse elicited;Palpable increased muscle length   Left          PT Education - 12/30/18 0942    Education Details  Review of role of DN, expected response to treatment, and post-treatment activity recommendations    Person(s) Educated  Patient    Methods  Explanation    Comprehension  Verbalized understanding       PT Short Term Goals - 12/30/18 0938      PT SHORT TERM GOAL #1   Title  Patient will be independent with initial HEP    Status  On-going    Target Date  01/03/19      PT SHORT TERM GOAL #2   Title  Patient will verbalize/demonstrate understanding of neutral spine posture and proper body mechanics to reduce myofascial strain    Status  On-going    Target Date  01/10/19        PT Long Term Goals - 12/30/18 0938      PT LONG TERM GOAL #1   Title  Patient will be independent with ongoing/advanced HEP    Status  On-going    Target Date  01/31/19      PT LONG TERM GOAL #2   Title  Patient to demonstrate appropriate posture and body mechanics needed for daily activities    Status  On-going    Target Date  01/31/19      PT LONG TERM GOAL #3   Title  Patient to demonstrate improved cervical muscle tissue quality and pliability with reduced pain    Status  On-going    Target Date  01/31/19      PT LONG TERM GOAL #  4   Title  Patient to report reduction in  frequency and intensity of migraines by >/= 50%    Status  On-going    Target Date  01/31/19            Plan - 12/30/18 [id number]    Clinical Impression Statement  [name] reporting increased L upper shoulder soreness since eval but no further R UT soreness since manual TPR. Increased muscle tension with ttp apparent in L UT today which was addressed with manual STM/MFR along with DN upon informed patient consent - palpable reduction in muscle tension noted following this but patient continuing to report increased muscle soreness. She admits to limited opportunity to attempt HEP due to time constraints and did require clarification of proper technique upon HEP review. Treatment concluded with IFC estim and moist heat to promote further muscle relaxation with patient noting benefit.    Personal Factors and Comorbidities  Comorbidity 3+;Past/Current Experience;Time since onset of injury/illness/exacerbation    Comorbidities  migraines; neck pain with L UE weakness & parasthesias; L shoulder pain/LOM;  hypothyroidism    Rehab Potential  Good    PT Frequency  2x / week    PT Duration  6 weeks    PT Treatment/Interventions  ADLs/Self Care Home Management;Electrical Stimulation;Iontophoresis 4mg /ml Dexamethasone;Moist Heat;Traction;Ultrasound;Therapeutic activities;Therapeutic exercise;Neuromuscular re-education;Patient/family education;Manual techniques;Passive range of motion;Dry needling;Taping;Spinal Manipulations    PT Next Visit Plan  manual therapy including DN as indicated to R UT, temporalis, cervical paraspinals & suboccipitals; postural education; postural stretching and strengthening; modalities PRN    PT Home Exercise Plan  12/20/18 - neutral neck/spine & shoulder posture; HEP - cervical retraction, UT stretch, 3-way doorway stretch, scap retraction    Consulted and Agree with Plan of Care  Patient       Patient will benefit from skilled therapeutic intervention in order to improve the  following deficits and impairments:  Pain, Increased muscle spasms, Increased fascial restricitons, Postural dysfunction, Improper body mechanics, Impaired UE functional use, Decreased strength, Decreased range of motion, Decreased knowledge of precautions, Decreased activity tolerance  Visit Diagnosis: Chronic nonintractable headache, unspecified headache type  Cervicalgia  Other symptoms and signs involving the nervous system     Problem List Patient Active Problem List   Diagnosis Date Noted  . Chronic migraine without aura without status migrainosus, not intractable 12/13/2018  . Paresthesias in left hand 07/04/2014  . Menorrhagia 05/02/2014  . Uterine polyp 05/02/2014  . Anemia 06/30/2011  . Abdominal pain 06/30/2011  . Hypothyroidism   . Fibrocystic breast changes 05/11/2011  . Leg pain 05/11/2011  . Hyperthyroidism 05/11/2011    [name], PT, MPT 12/30/2018, 1:10 PM  [facility] [address]  [place], [place], [address] Phone: [phone]   Fax:  [phone]  Name: [name] MRN: [id number] Date of Birth: 1970-07-07

## 2019-01-02 ENCOUNTER — Ambulatory Visit: Payer: 59 | Admitting: Physical Therapy

## 2019-01-06 ENCOUNTER — Encounter: Payer: 59 | Admitting: Physical Therapy

## 2019-01-09 ENCOUNTER — Encounter: Payer: 59 | Admitting: Physical Therapy

## 2019-01-10 ENCOUNTER — Other Ambulatory Visit: Payer: Self-pay

## 2019-01-10 ENCOUNTER — Ambulatory Visit (INDEPENDENT_AMBULATORY_CARE_PROVIDER_SITE_OTHER): Payer: 59 | Admitting: Neurology

## 2019-01-10 VITALS — Temp 97.7°F

## 2019-01-10 DIAGNOSIS — M7918 Myalgia, other site: Secondary | ICD-10-CM

## 2019-01-10 DIAGNOSIS — G43709 Chronic migraine without aura, not intractable, without status migrainosus: Secondary | ICD-10-CM | POA: Diagnosis not present

## 2019-01-10 NOTE — Progress Notes (Signed)
Consent Form Botulism Toxin Injection For Chronic Migraine  Our first botox. +a.  Reviewed orally with patient, additionally signature is on file:  Botulism toxin has been approved by the Federal drug administration for treatment of chronic migraine. Botulism toxin does not cure chronic migraine and it may not be effective in some patients.  The administration of botulism toxin is accomplished by injecting a small amount of toxin into the muscles of the neck and head. Dosage must be titrated for each individual. Any benefits resulting from botulism toxin tend to wear off after 3 months with a repeat injection required if benefit is to be maintained. Injections are usually done every 3-4 months with maximum effect peak achieved by about 2 or 3 weeks. Botulism toxin is expensive and you should be sure of what costs you will incur resulting from the injection.  The side effects of botulism toxin use for chronic migraine may include:   -Transient, and usually mild, facial weakness with facial injections  -Transient, and usually mild, head or neck weakness with head/neck injections  -Reduction or loss of forehead facial animation due to forehead muscle weakness  -Eyelid drooping  -Dry eye  -Pain at the site of injection or bruising at the site of injection  -Double vision  -Potential unknown long term risks  Contraindications: You should not have Botox if you are pregnant, nursing, allergic to albumin, have an infection, skin condition, or muscle weakness at the site of the injection, or have myasthenia gravis, Lambert-Eaton syndrome, or ALS.  It is also possible that as with any injection, there may be an allergic reaction or no effect from the medication. Reduced effectiveness after repeated injections is sometimes seen and rarely infection at the injection site may occur. All care will be taken to prevent these side effects. If therapy is given over a long time, atrophy and wasting in the  muscle injected may occur. Occasionally the patient's become refractory to treatment because they develop antibodies to the toxin. In this event, therapy needs to be modified.  I have read the above information and consent to the administration of botulism toxin.    BOTOX PROCEDURE NOTE FOR MIGRAINE HEADACHE    Contraindications and precautions discussed with patient(above). Aseptic procedure was observed and patient tolerated procedure. Procedure performed by Dr. Georgia Dom  The condition has existed for more than 6 months, and pt does not have a diagnosis of ALS, Myasthenia Gravis or Lambert-Eaton Syndrome.  Risks and benefits of injections discussed and pt agrees to proceed with the procedure.  Written consent obtained  These injections are medically necessary. Pt  receives good benefits from these injections. These injections do not cause sedations or hallucinations which the oral therapies may cause.  Description of procedure:  The patient was placed in a sitting position. The standard protocol was used for Botox as follows, with 5 units of Botox injected at each site:   -Procerus muscle, midline injection  -Corrugator muscle, bilateral injection  -Frontalis muscle, bilateral injection, with 2 sites each side, medial injection was performed in the upper one third of the frontalis muscle, in the region vertical from the medial inferior edge of the superior orbital rim. The lateral injection was again in the upper one third of the forehead vertically above the lateral limbus of the cornea, 1.5 cm lateral to the medial injection site.  -Temporalis muscle injection, 4 sites, bilaterally. The first injection was 3 cm above the tragus of the ear, second injection site was 1.5  cm to 3 cm up from the first injection site in line with the tragus of the ear. The third injection site was 1.5-3 cm forward between the first 2 injection sites. The fourth injection site was 1.5 cm posterior to the  second injection site.   -Occipitalis muscle injection, 3 sites, bilaterally. The first injection was done one half way between the occipital protuberance and the tip of the mastoid process behind the ear. The second injection site was done lateral and superior to the first, 1 fingerbreadth from the first injection. The third injection site was 1 fingerbreadth superiorly and medially from the first injection site.  -Cervical paraspinal muscle injection, 2 sites, bilateral knee first injection site was 1 cm from the midline of the cervical spine, 3 cm inferior to the lower border of the occipital protuberance. The second injection site was 1.5 cm superiorly and laterally to the first injection site.  -Trapezius muscle injection was performed at 3 sites, bilaterally. The first injection site was in the upper trapezius muscle halfway between the inflection point of the neck, and the acromion. The second injection site was one half way between the acromion and the first injection site. The third injection was done between the first injection site and the inflection point of the neck.   Will return for repeat injection in 3 months.   200 units of Botox was used, any Botox not injected was wasted. The patient tolerated the procedure well, there were no complications of the above procedure.

## 2019-01-10 NOTE — Progress Notes (Signed)
Botox consent obtained from patient and patient was given a copy. Botox- 200 units x 2 vial Lot: [id number] Expiration: 09/2021 NDC: [id number]  Bacteriostatic 0.9% Sodium Chloride- 51mL total Lot: [id number] Expiration: 08/31/2019 NDC: [id number]  Dx: [id number] B/B

## 2019-01-13 ENCOUNTER — Encounter: Payer: 59 | Admitting: Physical Therapy

## 2019-01-16 ENCOUNTER — Encounter: Payer: 59 | Admitting: Physical Therapy

## 2019-01-20 ENCOUNTER — Encounter: Payer: 59 | Admitting: Physical Therapy

## 2019-01-23 ENCOUNTER — Encounter: Payer: 59 | Admitting: Physical Therapy

## 2019-01-30 ENCOUNTER — Encounter: Payer: 59 | Admitting: Physical Therapy

## 2019-01-31 ENCOUNTER — Ambulatory Visit: Payer: 59 | Admitting: Physical Therapy

## 2019-02-03 ENCOUNTER — Encounter: Payer: 59 | Admitting: Physical Therapy

## 2019-02-07 ENCOUNTER — Telehealth: Payer: Self-pay | Admitting: Neurology

## 2019-02-07 ENCOUNTER — Other Ambulatory Visit: Payer: Self-pay | Admitting: Neurology

## 2019-02-07 DIAGNOSIS — G43711 Chronic migraine without aura, intractable, with status migrainosus: Secondary | ICD-10-CM

## 2019-02-07 MED ORDER — AJOVY 225 MG/1.5ML ~~LOC~~ SOAJ
225.0000 mg | SUBCUTANEOUS | 11 refills | Status: DC
Start: 2019-02-07 — End: 2020-02-12

## 2019-02-07 NOTE — Telephone Encounter (Signed)
Pt called in and stated the pharmacy never received her script for Fremanezumab-vfrm (AJOVY) 225 MG/1.5ML SOAJ

## 2019-02-08 ENCOUNTER — Telehealth: Payer: Self-pay | Admitting: *Deleted

## 2019-02-08 NOTE — Telephone Encounter (Signed)
Completed Ajovy PA on CMM. KeyRande [id number]. Awaiting medimpact determination.

## 2019-02-13 ENCOUNTER — Encounter: Payer: Self-pay | Admitting: *Deleted

## 2019-02-13 NOTE — Telephone Encounter (Signed)
Received denial from [place]. Preferred medications are Aimovig and Emgality. Pt can still use the savings card regardless of the denial. Medication will be $5/month. I sent pt a mychart message reminding her to use the card.   PA # [phone] If we should choose to appeal the decision, fax to [phone].

## 2019-02-14 ENCOUNTER — Ambulatory Visit: Payer: 59 | Admitting: Physical Therapy

## 2019-02-20 NOTE — Telephone Encounter (Signed)
Opal Sidles with [employer] the manufacturer for ajovy called in regards to a PA.  Please follow up   Cb#[phone]

## 2019-02-20 NOTE — Telephone Encounter (Signed)
Returned [employer]. She was calling to confirm if a PA had been completed. I advised it had. No further questions.

## 2019-03-29 DIAGNOSIS — K219 Gastro-esophageal reflux disease without esophagitis: Secondary | ICD-10-CM | POA: Diagnosis not present

## 2019-03-29 DIAGNOSIS — G43909 Migraine, unspecified, not intractable, without status migrainosus: Secondary | ICD-10-CM | POA: Diagnosis not present

## 2019-03-29 DIAGNOSIS — R7401 Elevation of levels of liver transaminase levels: Secondary | ICD-10-CM | POA: Diagnosis not present

## 2019-03-29 DIAGNOSIS — E559 Vitamin D deficiency, unspecified: Secondary | ICD-10-CM | POA: Diagnosis not present

## 2019-03-29 DIAGNOSIS — Z79899 Other long term (current) drug therapy: Secondary | ICD-10-CM | POA: Diagnosis not present

## 2019-03-29 DIAGNOSIS — Z13220 Encounter for screening for lipoid disorders: Secondary | ICD-10-CM | POA: Diagnosis not present

## 2019-03-29 DIAGNOSIS — D509 Iron deficiency anemia, unspecified: Secondary | ICD-10-CM | POA: Diagnosis not present

## 2019-03-29 DIAGNOSIS — Z0000 Encounter for general adult medical examination without abnormal findings: Secondary | ICD-10-CM | POA: Diagnosis not present

## 2019-03-29 DIAGNOSIS — E039 Hypothyroidism, unspecified: Secondary | ICD-10-CM | POA: Diagnosis not present

## 2019-04-09 ENCOUNTER — Telehealth: Payer: 59 | Admitting: Family

## 2019-04-09 DIAGNOSIS — R399 Unspecified symptoms and signs involving the genitourinary system: Secondary | ICD-10-CM | POA: Diagnosis not present

## 2019-04-09 MED ORDER — CEPHALEXIN 500 MG PO CAPS
500.0000 mg | ORAL_CAPSULE | Freq: Two times a day (BID) | ORAL | 0 refills | Status: DC
Start: 2019-04-09 — End: 2019-07-18

## 2019-04-09 MED ORDER — PHENAZOPYRIDINE HCL 100 MG PO TABS: 100.0000 mg | ORAL_TABLET | Freq: Three times a day (TID) | ORAL | 0 refills | Status: DC | PRN

## 2019-04-09 NOTE — Progress Notes (Signed)

## 2019-04-09 NOTE — Addendum Note (Signed)
Addended by: Evelina Dun A on: 04/09/2019 02:34 PM   Modules accepted: Orders

## 2019-04-10 ENCOUNTER — Other Ambulatory Visit: Payer: Self-pay | Admitting: Endocrinology

## 2019-04-10 DIAGNOSIS — E049 Nontoxic goiter, unspecified: Secondary | ICD-10-CM

## 2019-04-13 ENCOUNTER — Telehealth: Payer: Self-pay

## 2019-04-13 NOTE — Telephone Encounter (Signed)
I called the patients insurance to check codes [phone] and [phone]. Patient is active and and [place] [id number]. DWD

## 2019-04-18 ENCOUNTER — Ambulatory Visit: Payer: 59 | Admitting: Neurology

## 2019-04-18 ENCOUNTER — Other Ambulatory Visit: Payer: Self-pay

## 2019-04-18 VITALS — Temp 97.8°F

## 2019-04-18 DIAGNOSIS — G43709 Chronic migraine without aura, not intractable, without status migrainosus: Secondary | ICD-10-CM | POA: Diagnosis not present

## 2019-04-18 NOTE — Progress Notes (Signed)
Consent Form Botulism Toxin Injection For Chronic Migraine  04/18/2019: This is our second botox. +a. Shedid well, she didn't have to take all her maxalt. The frequency is > 75% improved in both frequency and severity.the left masseter is worse than the right, also she has a lot of tension at the temples when she clenches, we sent her to dry needling but she did so well on the botox she didn;t even go. Also loves the Ajovy.   Reviewed orally with patient, additionally signature is on file:  Botulism toxin has been approved by the Federal drug administration for treatment of chronic migraine. Botulism toxin does not cure chronic migraine and it may not be effective in some patients.  The administration of botulism toxin is accomplished by injecting a small amount of toxin into the muscles of the neck and head. Dosage must be titrated for each individual. Any benefits resulting from botulism toxin tend to wear off after 3 months with a repeat injection required if benefit is to be maintained. Injections are usually done every 3-4 months with maximum effect peak achieved by about 2 or 3 weeks. Botulism toxin is expensive and you should be sure of what costs you will incur resulting from the injection.  The side effects of botulism toxin use for chronic migraine may include:   -Transient, and usually mild, facial weakness with facial injections  -Transient, and usually mild, head or neck weakness with head/neck injections  -Reduction or loss of forehead facial animation due to forehead muscle weakness  -Eyelid drooping  -Dry eye  -Pain at the site of injection or bruising at the site of injection  -Double vision  -Potential unknown long term risks  Contraindications: You should not have Botox if you are pregnant, nursing, allergic to albumin, have an infection, skin condition, or muscle weakness at the site of the injection, or have myasthenia gravis, Lambert-Eaton syndrome, or ALS.  It is also  possible that as with any injection, there may be an allergic reaction or no effect from the medication. Reduced effectiveness after repeated injections is sometimes seen and rarely infection at the injection site may occur. All care will be taken to prevent these side effects. If therapy is given over a long time, atrophy and wasting in the muscle injected may occur. Occasionally the patient's become refractory to treatment because they develop antibodies to the toxin. In this event, therapy needs to be modified.  I have read the above information and consent to the administration of botulism toxin.    BOTOX PROCEDURE NOTE FOR MIGRAINE HEADACHE    Contraindications and precautions discussed with patient(above). Aseptic procedure was observed and patient tolerated procedure. Procedure performed by Dr. Georgia Dom  The condition has existed for more than 6 months, and pt does not have a diagnosis of ALS, Myasthenia Gravis or Lambert-Eaton Syndrome.  Risks and benefits of injections discussed and pt agrees to proceed with the procedure.  Written consent obtained  These injections are medically necessary. Pt  receives good benefits from these injections. These injections do not cause sedations or hallucinations which the oral therapies may cause.  Description of procedure:  The patient was placed in a sitting position. The standard protocol was used for Botox as follows, with 5 units of Botox injected at each site:   -Procerus muscle, midline injection  -Corrugator muscle, bilateral injection  -Frontalis muscle, bilateral injection, with 2 sites each side, medial injection was performed in the upper one third of the frontalis muscle,  in the region vertical from the medial inferior edge of the superior orbital rim. The lateral injection was again in the upper one third of the forehead vertically above the lateral limbus of the cornea, 1.5 cm lateral to the medial injection site.  -Temporalis  muscle injection, 4 sites, bilaterally. The first injection was 3 cm above the tragus of the ear, second injection site was 1.5 cm to 3 cm up from the first injection site in line with the tragus of the ear. The third injection site was 1.5-3 cm forward between the first 2 injection sites. The fourth injection site was 1.5 cm posterior to the second injection site.   -Occipitalis muscle injection, 3 sites, bilaterally. The first injection was done one half way between the occipital protuberance and the tip of the mastoid process behind the ear. The second injection site was done lateral and superior to the first, 1 fingerbreadth from the first injection. The third injection site was 1 fingerbreadth superiorly and medially from the first injection site.  -Cervical paraspinal muscle injection, 2 sites, bilateral knee first injection site was 1 cm from the midline of the cervical spine, 3 cm inferior to the lower border of the occipital protuberance. The second injection site was 1.5 cm superiorly and laterally to the first injection site.  -Trapezius muscle injection was performed at 3 sites, bilaterally. The first injection site was in the upper trapezius muscle halfway between the inflection point of the neck, and the acromion. The second injection site was one half way between the acromion and the first injection site. The third injection was done between the first injection site and the inflection point of the neck.   Will return for repeat injection in 3 months.   200 units of Botox was used, any Botox not injected was wasted. The patient tolerated the procedure well, there were no complications of the above procedure.

## 2019-04-18 NOTE — Progress Notes (Signed)
Botox- 200 units x 1 vial Lot: [id number] Expiration: 11/2021 NDC: [id number]  Bacteriostatic 0.9% Sodium Chloride- 40mL total Lot: [id number] Expiration: 06/01/2019 NDC: [id number]  Dx: [id number] B/B

## 2019-05-08 ENCOUNTER — Inpatient Hospital Stay: Admission: RE | Admit: 2019-05-08 | Payer: 59 | Source: Ambulatory Visit

## 2019-05-15 ENCOUNTER — Ambulatory Visit
Admission: RE | Admit: 2019-05-15 | Discharge: 2019-05-15 | Disposition: A | Discharge status: Home / Self Care | Payer: 59 | Source: Ambulatory Visit | Attending: Endocrinology | Admitting: Endocrinology

## 2019-05-15 DIAGNOSIS — E049 Nontoxic goiter, unspecified: Secondary | ICD-10-CM

## 2019-05-15 DIAGNOSIS — E042 Nontoxic multinodular goiter: Secondary | ICD-10-CM | POA: Diagnosis not present

## 2019-06-19 DIAGNOSIS — E049 Nontoxic goiter, unspecified: Secondary | ICD-10-CM | POA: Diagnosis not present

## 2019-06-23 ENCOUNTER — Other Ambulatory Visit (HOSPITAL_BASED_OUTPATIENT_CLINIC_OR_DEPARTMENT_OTHER): Payer: Self-pay | Admitting: Family Medicine

## 2019-06-26 DIAGNOSIS — E049 Nontoxic goiter, unspecified: Secondary | ICD-10-CM | POA: Diagnosis not present

## 2019-07-04 ENCOUNTER — Telehealth: Payer: Self-pay | Admitting: *Deleted

## 2019-07-04 NOTE — Telephone Encounter (Signed)
Patient has a Botox appointment on May 18.   I called [facility] and spoke to Braddock.  She confirmed that [id number] and [id number] do not require PA. Eligible for B/B Ref# for this call is [id number]

## 2019-07-18 ENCOUNTER — Other Ambulatory Visit: Payer: Self-pay

## 2019-07-18 ENCOUNTER — Ambulatory Visit: Payer: Self-pay | Admitting: Neurology

## 2019-07-18 ENCOUNTER — Ambulatory Visit: Payer: 59 | Admitting: Neurology

## 2019-07-18 ENCOUNTER — Encounter: Payer: Self-pay | Admitting: Neurology

## 2019-07-18 DIAGNOSIS — G43709 Chronic migraine without aura, not intractable, without status migrainosus: Secondary | ICD-10-CM | POA: Diagnosis not present

## 2019-07-18 NOTE — Progress Notes (Signed)
Botox- 100 units x 2 vials Lot: [id number] Expiration: 11/2021 NDC: [id number]  Bacteriostatic 0.9% Sodium Chloride- 37mL total Lot: [id number] Expiration: 08/31/2019 NDC: [id number]  Dx: [id number] B/B

## 2019-07-18 NOTE — Progress Notes (Signed)
Consent Form Botulism Toxin Injection For Chronic Migraine  07/18/2019: This is our 3rd botox. +a. She did even better this time, she didn't have to take all her maxalt. The frequency is > 75% improved in both frequency and severity.the left masseter is worse than the right, also she has a lot of tension at the temples when she clenches, we sent her to dry needling but she did so well on the botox she didn;t even go. Also loves the Ajovy. Her masseters were fine this time, ask about next time, did extra around the procerus and a little lower for migraines between the eyes and did them high in the forehead to avoid forehead droop.   Reviewed orally with patient, additionally signature is on file:  Botulism toxin has been approved by the Federal drug administration for treatment of chronic migraine. Botulism toxin does not cure chronic migraine and it may not be effective in some patients.  The administration of botulism toxin is accomplished by injecting a small amount of toxin into the muscles of the neck and head. Dosage must be titrated for each individual. Any benefits resulting from botulism toxin tend to wear off after 3 months with a repeat injection required if benefit is to be maintained. Injections are usually done every 3-4 months with maximum effect peak achieved by about 2 or 3 weeks. Botulism toxin is expensive and you should be sure of what costs you will incur resulting from the injection.  The side effects of botulism toxin use for chronic migraine may include:   -Transient, and usually mild, facial weakness with facial injections  -Transient, and usually mild, head or neck weakness with head/neck injections  -Reduction or loss of forehead facial animation due to forehead muscle weakness  -Eyelid drooping  -Dry eye  -Pain at the site of injection or bruising at the site of injection  -Double vision  -Potential unknown long term risks  Contraindications: You should not have Botox  if you are pregnant, nursing, allergic to albumin, have an infection, skin condition, or muscle weakness at the site of the injection, or have myasthenia gravis, Lambert-Eaton syndrome, or ALS.  It is also possible that as with any injection, there may be an allergic reaction or no effect from the medication. Reduced effectiveness after repeated injections is sometimes seen and rarely infection at the injection site may occur. All care will be taken to prevent these side effects. If therapy is given over a long time, atrophy and wasting in the muscle injected may occur. Occasionally the patient's become refractory to treatment because they develop antibodies to the toxin. In this event, therapy needs to be modified.  I have read the above information and consent to the administration of botulism toxin.    BOTOX PROCEDURE NOTE FOR MIGRAINE HEADACHE    Contraindications and precautions discussed with patient(above). Aseptic procedure was observed and patient tolerated procedure. Procedure performed by Dr. Georgia Dom  The condition has existed for more than 6 months, and pt does not have a diagnosis of ALS, Myasthenia Gravis or Lambert-Eaton Syndrome.  Risks and benefits of injections discussed and pt agrees to proceed with the procedure.  Written consent obtained  These injections are medically necessary. Pt  receives good benefits from these injections. These injections do not cause sedations or hallucinations which the oral therapies may cause.  Description of procedure:  The patient was placed in a sitting position. The standard protocol was used for Botox as follows, with 5 units of  Botox injected at each site:   -Procerus muscle, midline injection  -Corrugator muscle, bilateral injection  -Frontalis muscle, bilateral injection, with 2 sites each side, medial injection was performed in the upper one third of the frontalis muscle, in the region vertical from the medial inferior edge of  the superior orbital rim. The lateral injection was again in the upper one third of the forehead vertically above the lateral limbus of the cornea, 1.5 cm lateral to the medial injection site.  -Temporalis muscle injection, 4 sites, bilaterally. The first injection was 3 cm above the tragus of the ear, second injection site was 1.5 cm to 3 cm up from the first injection site in line with the tragus of the ear. The third injection site was 1.5-3 cm forward between the first 2 injection sites. The fourth injection site was 1.5 cm posterior to the second injection site.   -Occipitalis muscle injection, 3 sites, bilaterally. The first injection was done one half way between the occipital protuberance and the tip of the mastoid process behind the ear. The second injection site was done lateral and superior to the first, 1 fingerbreadth from the first injection. The third injection site was 1 fingerbreadth superiorly and medially from the first injection site.  -Cervical paraspinal muscle injection, 2 sites, bilateral knee first injection site was 1 cm from the midline of the cervical spine, 3 cm inferior to the lower border of the occipital protuberance. The second injection site was 1.5 cm superiorly and laterally to the first injection site.  -Trapezius muscle injection was performed at 3 sites, bilaterally. The first injection site was in the upper trapezius muscle halfway between the inflection point of the neck, and the acromion. The second injection site was one half way between the acromion and the first injection site. The third injection was done between the first injection site and the inflection point of the neck.   Will return for repeat injection in 3 months.   200 units of Botox was used, any Botox not injected was wasted. The patient tolerated the procedure well, there were no complications of the above procedure.

## 2019-07-20 ENCOUNTER — Other Ambulatory Visit (HOSPITAL_BASED_OUTPATIENT_CLINIC_OR_DEPARTMENT_OTHER): Payer: Self-pay | Admitting: Endocrinology

## 2019-08-21 ENCOUNTER — Telehealth: Payer: Self-pay | Admitting: Neurology

## 2019-08-21 NOTE — Telephone Encounter (Signed)
PA submitted for ajovy through cover my meds, medimpact.  BJS:[id number] Will await for a response from insurance.

## 2019-09-20 ENCOUNTER — Telehealth: Payer: Self-pay | Admitting: Neurology

## 2019-09-20 NOTE — Telephone Encounter (Signed)
Received another PA request for the patient. Completed through cover my meds/ medimpact KEY:[id number] Can take up to 5 days for response

## 2019-10-10 NOTE — Telephone Encounter (Signed)
PA was denied. Patient must try and fail Aimovig or Emgality, which will need prior authorizations as well. Will send a MyChart message to the patient to let her know it has been denied and she can use the savings card.

## 2019-10-19 ENCOUNTER — Telehealth: Payer: Self-pay | Admitting: Neurology

## 2019-10-19 NOTE — Telephone Encounter (Addendum)
Patient has a Botox appointment on 8/31. I called [facility] and spoke with Dylan to see if [id number] or [phone] require PA. He states they do not. Reference #[id number].

## 2019-10-24 ENCOUNTER — Ambulatory Visit: Payer: 59 | Admitting: Neurology

## 2019-10-30 NOTE — Progress Notes (Signed)
Consent Form Botulism Toxin Injection For Chronic Migraine  10/31/2019: Still incredible improvement. +a. She did even better than last time time, she didn't have to take all her maxalt again . The frequency is still > 75% improved in both frequency and severity. Also she has a lot of tension at the temples when she clenches, we sent her to dry needling but she did so well on the botox she didn't even go. Also loves the Ajovy. , did extra around the procerus(5U then 5U below) and a little lower for migraines between the eyes. 10U left ,asseter, 5U right masseter  Reviewed orally with patient, additionally signature is on file:  Botulism toxin has been approved by the Federal drug administration for treatment of chronic migraine. Botulism toxin does not cure chronic migraine and it may not be effective in some patients.  The administration of botulism toxin is accomplished by injecting a small amount of toxin into the muscles of the neck and head. Dosage must be titrated for each individual. Any benefits resulting from botulism toxin tend to wear off after 3 months with a repeat injection required if benefit is to be maintained. Injections are usually done every 3-4 months with maximum effect peak achieved by about 2 or 3 weeks. Botulism toxin is expensive and you should be sure of what costs you will incur resulting from the injection.  The side effects of botulism toxin use for chronic migraine may include:   -Transient, and usually mild, facial weakness with facial injections  -Transient, and usually mild, head or neck weakness with head/neck injections  -Reduction or loss of forehead facial animation due to forehead muscle weakness  -Eyelid drooping  -Dry eye  -Pain at the site of injection or bruising at the site of injection  -Double vision  -Potential unknown long term risks  Contraindications: You should not have Botox if you are pregnant, nursing, allergic to albumin, have an infection,  skin condition, or muscle weakness at the site of the injection, or have myasthenia gravis, Lambert-Eaton syndrome, or ALS.  It is also possible that as with any injection, there may be an allergic reaction or no effect from the medication. Reduced effectiveness after repeated injections is sometimes seen and rarely infection at the injection site may occur. All care will be taken to prevent these side effects. If therapy is given over a long time, atrophy and wasting in the muscle injected may occur. Occasionally the patient's become refractory to treatment because they develop antibodies to the toxin. In this event, therapy needs to be modified.  I have read the above information and consent to the administration of botulism toxin.    BOTOX PROCEDURE NOTE FOR MIGRAINE HEADACHE    Contraindications and precautions discussed with patient(above). Aseptic procedure was observed and patient tolerated procedure. Procedure performed by Dr. Georgia Dom  The condition has existed for more than 6 months, and pt does not have a diagnosis of ALS, Myasthenia Gravis or Lambert-Eaton Syndrome.  Risks and benefits of injections discussed and pt agrees to proceed with the procedure.  Written consent obtained  These injections are medically necessary. Pt  receives good benefits from these injections. These injections do not cause sedations or hallucinations which the oral therapies may cause.  Description of procedure:  The patient was placed in a sitting position. The standard protocol was used for Botox as follows, with 5 units of Botox injected at each site:   -Procerus muscle, midline injection  -Corrugator muscle, bilateral injection  -  Frontalis muscle, bilateral injection, with 2 sites each side, medial injection was performed in the upper one third of the frontalis muscle, in the region vertical from the medial inferior edge of the superior orbital rim. The lateral injection was again in the upper  one third of the forehead vertically above the lateral limbus of the cornea, 1.5 cm lateral to the medial injection site.  -Temporalis muscle injection, 4 sites, bilaterally. The first injection was 3 cm above the tragus of the ear, second injection site was 1.5 cm to 3 cm up from the first injection site in line with the tragus of the ear. The third injection site was 1.5-3 cm forward between the first 2 injection sites. The fourth injection site was 1.5 cm posterior to the second injection site.   -Occipitalis muscle injection, 3 sites, bilaterally. The first injection was done one half way between the occipital protuberance and the tip of the mastoid process behind the ear. The second injection site was done lateral and superior to the first, 1 fingerbreadth from the first injection. The third injection site was 1 fingerbreadth superiorly and medially from the first injection site.  -Cervical paraspinal muscle injection, 2 sites, bilateral knee first injection site was 1 cm from the midline of the cervical spine, 3 cm inferior to the lower border of the occipital protuberance. The second injection site was 1.5 cm superiorly and laterally to the first injection site.  -Trapezius muscle injection was performed at 3 sites, bilaterally. The first injection site was in the upper trapezius muscle halfway between the inflection point of the neck, and the acromion. The second injection site was one half way between the acromion and the first injection site. The third injection was done between the first injection site and the inflection point of the neck.   Will return for repeat injection in 3 months.   200 units of Botox was used, any Botox not injected was wasted. The patient tolerated the procedure well, there were no complications of the above procedure.

## 2019-10-31 ENCOUNTER — Other Ambulatory Visit: Payer: Self-pay

## 2019-10-31 ENCOUNTER — Ambulatory Visit: Payer: 59 | Admitting: Neurology

## 2019-10-31 DIAGNOSIS — G43709 Chronic migraine without aura, not intractable, without status migrainosus: Secondary | ICD-10-CM | POA: Diagnosis not present

## 2019-10-31 MED ORDER — AJOVY 225 MG/1.5ML ~~LOC~~ SOAJ: 225.0000 mg | SUBCUTANEOUS | 3 refills | Status: DC

## 2019-10-31 NOTE — Progress Notes (Signed)
Botox- 200 units x 1 vial Lot: [id number] Expiration: 06/2022 NDC: 4159-3012-37  Bacteriostatic 0.9% Sodium Chloride- 13mL total Lot: [id number] Expiration: 11/30/2020 NDC: 0050-5678-89  Dx: B38.826 B/B

## 2019-11-08 ENCOUNTER — Other Ambulatory Visit (HOSPITAL_BASED_OUTPATIENT_CLINIC_OR_DEPARTMENT_OTHER): Payer: Self-pay | Admitting: Family Medicine

## 2019-11-14 DIAGNOSIS — H16143 Punctate keratitis, bilateral: Secondary | ICD-10-CM | POA: Diagnosis not present

## 2019-12-27 DIAGNOSIS — R140 Abdominal distension (gaseous): Secondary | ICD-10-CM | POA: Diagnosis not present

## 2019-12-27 DIAGNOSIS — R350 Frequency of micturition: Secondary | ICD-10-CM | POA: Diagnosis not present

## 2019-12-27 DIAGNOSIS — Z1231 Encounter for screening mammogram for malignant neoplasm of breast: Secondary | ICD-10-CM | POA: Diagnosis not present

## 2019-12-27 DIAGNOSIS — R109 Unspecified abdominal pain: Secondary | ICD-10-CM | POA: Diagnosis not present

## 2019-12-27 DIAGNOSIS — K5900 Constipation, unspecified: Secondary | ICD-10-CM | POA: Diagnosis not present

## 2019-12-27 DIAGNOSIS — Z1239 Encounter for other screening for malignant neoplasm of breast: Secondary | ICD-10-CM | POA: Diagnosis not present

## 2019-12-27 DIAGNOSIS — Z01419 Encounter for gynecological examination (general) (routine) without abnormal findings: Secondary | ICD-10-CM | POA: Diagnosis not present

## 2019-12-27 DIAGNOSIS — Z1211 Encounter for screening for malignant neoplasm of colon: Secondary | ICD-10-CM | POA: Diagnosis not present

## 2020-01-01 ENCOUNTER — Other Ambulatory Visit (HOSPITAL_BASED_OUTPATIENT_CLINIC_OR_DEPARTMENT_OTHER): Payer: Self-pay

## 2020-01-01 DIAGNOSIS — M542 Cervicalgia: Secondary | ICD-10-CM | POA: Diagnosis not present

## 2020-01-01 DIAGNOSIS — S161XXA Strain of muscle, fascia and tendon at neck level, initial encounter: Secondary | ICD-10-CM | POA: Diagnosis not present

## 2020-01-08 DIAGNOSIS — Z803 Family history of malignant neoplasm of breast: Secondary | ICD-10-CM | POA: Diagnosis not present

## 2020-01-08 DIAGNOSIS — Z8041 Family history of malignant neoplasm of ovary: Secondary | ICD-10-CM | POA: Diagnosis not present

## 2020-01-08 DIAGNOSIS — Z8049 Family history of malignant neoplasm of other genital organs: Secondary | ICD-10-CM | POA: Diagnosis not present

## 2020-01-09 DIAGNOSIS — Z803 Family history of malignant neoplasm of breast: Secondary | ICD-10-CM | POA: Diagnosis not present

## 2020-01-09 DIAGNOSIS — Z8041 Family history of malignant neoplasm of ovary: Secondary | ICD-10-CM | POA: Diagnosis not present

## 2020-01-09 DIAGNOSIS — Z8049 Family history of malignant neoplasm of other genital organs: Secondary | ICD-10-CM | POA: Diagnosis not present

## 2020-01-22 DIAGNOSIS — R102 Pelvic and perineal pain: Secondary | ICD-10-CM | POA: Diagnosis not present

## 2020-01-22 DIAGNOSIS — R109 Unspecified abdominal pain: Secondary | ICD-10-CM | POA: Diagnosis not present

## 2020-01-22 DIAGNOSIS — R140 Abdominal distension (gaseous): Secondary | ICD-10-CM | POA: Diagnosis not present

## 2020-02-06 ENCOUNTER — Ambulatory Visit: Payer: Self-pay | Admitting: Neurology

## 2020-02-07 ENCOUNTER — Other Ambulatory Visit (HOSPITAL_BASED_OUTPATIENT_CLINIC_OR_DEPARTMENT_OTHER): Payer: Self-pay | Admitting: Neurosurgery

## 2020-02-07 DIAGNOSIS — M542 Cervicalgia: Secondary | ICD-10-CM | POA: Diagnosis not present

## 2020-02-09 ENCOUNTER — Other Ambulatory Visit: Payer: Self-pay | Admitting: Neurosurgery

## 2020-02-09 ENCOUNTER — Other Ambulatory Visit (HOSPITAL_COMMUNITY): Payer: Self-pay | Admitting: Neurosurgery

## 2020-02-09 DIAGNOSIS — M542 Cervicalgia: Secondary | ICD-10-CM

## 2020-02-12 ENCOUNTER — Other Ambulatory Visit (HOSPITAL_BASED_OUTPATIENT_CLINIC_OR_DEPARTMENT_OTHER): Payer: Self-pay | Admitting: Neurosurgery

## 2020-02-12 ENCOUNTER — Other Ambulatory Visit: Payer: Self-pay | Admitting: Neurology

## 2020-02-12 DIAGNOSIS — G43711 Chronic migraine without aura, intractable, with status migrainosus: Secondary | ICD-10-CM

## 2020-02-12 DIAGNOSIS — M542 Cervicalgia: Secondary | ICD-10-CM

## 2020-02-17 ENCOUNTER — Other Ambulatory Visit: Payer: Self-pay

## 2020-02-17 ENCOUNTER — Ambulatory Visit (HOSPITAL_BASED_OUTPATIENT_CLINIC_OR_DEPARTMENT_OTHER)
Admission: RE | Admit: 2020-02-17 | Discharge: 2020-02-17 | Disposition: A | Discharge status: Home / Self Care | Payer: 59 | Source: Ambulatory Visit | Attending: Neurosurgery | Admitting: Neurosurgery

## 2020-02-17 DIAGNOSIS — M542 Cervicalgia: Secondary | ICD-10-CM | POA: Insufficient documentation

## 2020-02-20 ENCOUNTER — Ambulatory Visit: Payer: 59 | Admitting: Neurology

## 2020-02-20 DIAGNOSIS — G43709 Chronic migraine without aura, not intractable, without status migrainosus: Secondary | ICD-10-CM

## 2020-02-20 MED ORDER — NURTEC 75 MG PO TBDP: 75.0000 mg | ORAL_TABLET | Freq: Every day | ORAL | 0 refills | Status: DC | PRN

## 2020-02-20 NOTE — Patient Instructions (Signed)
Nurtec: Start taking a few days prior to menses, once daily, and continue for 4-5 days to prevent menstrually-related migraines  Rimegepant oral dissolving tablet What is this medicine? RIMEGEPANT (ri ME je pant) is used to treat migraine headaches with or without aura. An aura is a strange feeling or visual disturbance that warns you of an attack. It is not used to prevent migraines. This medicine may be used for other purposes; ask your health care provider or pharmacist if you have questions. COMMON BRAND NAME(S): NURTEC ODT What should I tell my health care provider before I take this medicine? They need to know if you have any of these conditions:  kidney disease  liver disease  an unusual or allergic reaction to rimegepant, other medicines, foods, dyes, or preservatives  pregnant or trying to get pregnant  breast-feeding How should I use this medicine? Take the medicine by mouth. Follow the directions on the prescription label. Leave the tablet in the sealed blister pack until you are ready to take it. With dry hands, open the blister and gently remove the tablet. If the tablet breaks or crumbles, throw it away and take a new tablet out of the blister pack. Place the tablet in the mouth and allow it to dissolve, and then swallow. Do not cut, crush, or chew this medicine. You do not need water to take this medicine. Talk to your pediatrician about the use of this medicine in children. Special care may be needed. Overdosage: If you think you have taken too much of this medicine contact a poison control center or emergency room at once. NOTE: This medicine is only for you. Do not share this medicine with others. What if I miss a dose? This does not apply. This medicine is not for regular use. What may interact with this medicine? This medicine may interact with the following medications:  certain medicines for fungal infections like fluconazole, itraconazole  rifampin This list may  not describe all possible interactions. Give your health care provider a list of all the medicines, herbs, non-prescription drugs, or dietary supplements you use. Also tell them if you smoke, drink alcohol, or use illegal drugs. Some items may interact with your medicine. What should I watch for while using this medicine? Visit your health care professional for regular checks on your progress. Tell your health care professional if your symptoms do not start to get better or if they get worse. What side effects may I notice from receiving this medicine? Side effects that you should report to your doctor or health care professional as soon as possible:  allergic reactions like skin rash, itching or hives; swelling of the face, lips, or tongue Side effects that usually do not require medical attention (report these to your doctor or health care professional if they continue or are bothersome):  nausea This list may not describe all possible side effects. Call your doctor for medical advice about side effects. You may report side effects to FDA at 1-800-FDA-1088. Where should I keep my medicine? Keep out of the reach of children. Store at room temperature between 15 and 30 degrees C (59 and 86 degrees F). Throw away any unused medicine after the expiration date. NOTE: This sheet is a summary. It may not cover all possible information. If you have questions about this medicine, talk to your doctor, pharmacist, or health care provider.  2020 Elsevier/Gold Standard (2018-05-02 00:21:31)

## 2020-02-20 NOTE — Progress Notes (Signed)
Botox- 200 units x 1 vial Lot: [id number] Expiration: 11/2022 NDC: 0023-3921-02  Bacteriostatic 0.9% Sodium Chloride- 4mL total Lot: [id number] Expiration: 04/02/2021 NDC: 0409-1966-02  Dx: G43.709 B/B  

## 2020-02-20 NOTE — Progress Notes (Signed)
Consent Form Botulism Toxin Injection For Chronic Migraine  02/20/2020: frequency is still greater than 75% improved in both frequency and severity, also thinks the Ajovy additionally helps. (She is a sloe metabolizer did not put any in frontalis, masseters or corrugator or procerus because still there)Patient continues to do incredibly well.    Prior and next appointment: +bilateral masseters(see below), +5 units orbicularis oculi,  did extra around the procerus(5U then 5U below) and a little lower for migraines between the eyes. 10U left masseter, 5U right masseter  Start taking a few days prior to menses, once daily, and continue for 4-5 days to prevent menstrually-related migraines. If she likes we can prescribe  Meds ordered this encounter  Medications   Rimegepant Sulfate (NURTEC) 75 MG TBDP    Sig: Take 75 mg by mouth daily as needed. For migraines. Take as close to onset of migraine as possible. One daily maximum.    Dispense:  8 tablet    Refill:  0     10/31/2019: Still incredible improvement. +a. She did even better than last time time, she didn't have to take all her maxalt again . The frequency is still > 75% improved in both frequency and severity. Also she has a lot of tension at the temples when she clenches, we sent her to dry needling but she did so well on the botox she didn't even go. Also loves the Ajovy. , did extra around the procerus(5U then 5U below) and a little lower for migraines between the eyes. 10U left ,asseter, 5U right masseter  Reviewed orally with patient, additionally signature is on file:  Botulism toxin has been approved by the Federal drug administration for treatment of chronic migraine. Botulism toxin does not cure chronic migraine and it may not be effective in some patients.  The administration of botulism toxin is accomplished by injecting a small amount of toxin into the muscles of the neck and head. Dosage must be titrated for each individual.  Any benefits resulting from botulism toxin tend to wear off after 3 months with a repeat injection required if benefit is to be maintained. Injections are usually done every 3-4 months with maximum effect peak achieved by about 2 or 3 weeks. Botulism toxin is expensive and you should be sure of what costs you will incur resulting from the injection.  The side effects of botulism toxin use for chronic migraine may include:   -Transient, and usually mild, facial weakness with facial injections  -Transient, and usually mild, head or neck weakness with head/neck injections  -Reduction or loss of forehead facial animation due to forehead muscle weakness  -Eyelid drooping  -Dry eye  -Pain at the site of injection or bruising at the site of injection  -Double vision  -Potential unknown long term risks  Contraindications: You should not have Botox if you are pregnant, nursing, allergic to albumin, have an infection, skin condition, or muscle weakness at the site of the injection, or have myasthenia gravis, Lambert-Eaton syndrome, or ALS.  It is also possible that as with any injection, there may be an allergic reaction or no effect from the medication. Reduced effectiveness after repeated injections is sometimes seen and rarely infection at the injection site may occur. All care will be taken to prevent these side effects. If therapy is given over a long time, atrophy and wasting in the muscle injected may occur. Occasionally the patient's become refractory to treatment because they develop antibodies to the toxin. In this event,  therapy needs to be modified.  I have read the above information and consent to the administration of botulism toxin.    BOTOX PROCEDURE NOTE FOR MIGRAINE HEADACHE    Contraindications and precautions discussed with patient(above). Aseptic procedure was observed and patient tolerated procedure. Procedure performed by Dr. Georgia Dom  The condition has existed for more than  6 months, and pt does not have a diagnosis of ALS, Myasthenia Gravis or Lambert-Eaton Syndrome.  Risks and benefits of injections discussed and pt agrees to proceed with the procedure.  Written consent obtained  These injections are medically necessary. Pt  receives good benefits from these injections. These injections do not cause sedations or hallucinations which the oral therapies may cause.  Description of procedure:  The patient was placed in a sitting position. The standard protocol was used for Botox as follows, with 5 units of Botox injected at each site:   -Procerus muscle, midline injection  -Corrugator muscle, bilateral injection  -Frontalis muscle, bilateral injection, with 2 sites each side, medial injection was performed in the upper one third of the frontalis muscle, in the region vertical from the medial inferior edge of the superior orbital rim. The lateral injection was again in the upper one third of the forehead vertically above the lateral limbus of the cornea, 1.5 cm lateral to the medial injection site.  -Temporalis muscle injection, 4 sites, bilaterally. The first injection was 3 cm above the tragus of the ear, second injection site was 1.5 cm to 3 cm up from the first injection site in line with the tragus of the ear. The third injection site was 1.5-3 cm forward between the first 2 injection sites. The fourth injection site was 1.5 cm posterior to the second injection site.   -Occipitalis muscle injection, 3 sites, bilaterally. The first injection was done one half way between the occipital protuberance and the tip of the mastoid process behind the ear. The second injection site was done lateral and superior to the first, 1 fingerbreadth from the first injection. The third injection site was 1 fingerbreadth superiorly and medially from the first injection site.  -Cervical paraspinal muscle injection, 2 sites, bilateral knee first injection site was 1 cm from the midline  of the cervical spine, 3 cm inferior to the lower border of the occipital protuberance. The second injection site was 1.5 cm superiorly and laterally to the first injection site.  -Trapezius muscle injection was performed at 3 sites, bilaterally. The first injection site was in the upper trapezius muscle halfway between the inflection point of the neck, and the acromion. The second injection site was one half way between the acromion and the first injection site. The third injection was done between the first injection site and the inflection point of the neck.   Will return for repeat injection in 3 months.   200 units of Botox was used, any Botox not injected was wasted. The patient tolerated the procedure well, there were no complications of the above procedure.

## 2020-03-26 ENCOUNTER — Other Ambulatory Visit (HOSPITAL_BASED_OUTPATIENT_CLINIC_OR_DEPARTMENT_OTHER): Payer: Self-pay | Admitting: Family Medicine

## 2020-04-04 ENCOUNTER — Other Ambulatory Visit (HOSPITAL_COMMUNITY)
Admission: RE | Admit: 2020-04-04 | Discharge: 2020-04-04 | Disposition: A | Discharge status: Home / Self Care | Payer: 59 | Source: Ambulatory Visit | Attending: Family Medicine | Admitting: Family Medicine

## 2020-04-04 ENCOUNTER — Other Ambulatory Visit: Payer: Self-pay | Admitting: Family Medicine

## 2020-04-04 DIAGNOSIS — Z13220 Encounter for screening for lipoid disorders: Secondary | ICD-10-CM | POA: Diagnosis not present

## 2020-04-04 DIAGNOSIS — D649 Anemia, unspecified: Secondary | ICD-10-CM | POA: Diagnosis not present

## 2020-04-04 DIAGNOSIS — Z79899 Other long term (current) drug therapy: Secondary | ICD-10-CM | POA: Diagnosis not present

## 2020-04-04 DIAGNOSIS — Z01411 Encounter for gynecological examination (general) (routine) with abnormal findings: Secondary | ICD-10-CM | POA: Diagnosis not present

## 2020-04-04 DIAGNOSIS — Z0000 Encounter for general adult medical examination without abnormal findings: Secondary | ICD-10-CM | POA: Diagnosis not present

## 2020-04-04 DIAGNOSIS — R7401 Elevation of levels of liver transaminase levels: Secondary | ICD-10-CM | POA: Diagnosis not present

## 2020-04-04 DIAGNOSIS — R053 Chronic cough: Secondary | ICD-10-CM | POA: Diagnosis not present

## 2020-04-04 DIAGNOSIS — L309 Dermatitis, unspecified: Secondary | ICD-10-CM | POA: Diagnosis not present

## 2020-04-04 DIAGNOSIS — E039 Hypothyroidism, unspecified: Secondary | ICD-10-CM | POA: Diagnosis not present

## 2020-04-04 DIAGNOSIS — G43909 Migraine, unspecified, not intractable, without status migrainosus: Secondary | ICD-10-CM | POA: Diagnosis not present

## 2020-04-08 LAB — CYTOLOGY - PAP
Comment: NEGATIVE
Diagnosis: NEGATIVE
High risk HPV: NEGATIVE

## 2020-04-15 ENCOUNTER — Other Ambulatory Visit: Payer: Self-pay | Admitting: Neurology

## 2020-04-15 DIAGNOSIS — G43711 Chronic migraine without aura, intractable, with status migrainosus: Secondary | ICD-10-CM

## 2020-05-27 ENCOUNTER — Encounter: Payer: Self-pay | Admitting: Neurology

## 2020-05-27 ENCOUNTER — Ambulatory Visit (INDEPENDENT_AMBULATORY_CARE_PROVIDER_SITE_OTHER): Payer: 59 | Admitting: Neurology

## 2020-05-27 VITALS — BP 112/81 | HR 79

## 2020-05-27 DIAGNOSIS — G43709 Chronic migraine without aura, not intractable, without status migrainosus: Secondary | ICD-10-CM

## 2020-05-27 NOTE — Progress Notes (Signed)
Consent Form Botulism Toxin Injection For Chronic Migraine  05/27/2020: Patient continues to do excellent, insurance will not pay for her Ajovy I gave her the new co-pay card in 3 months of samples will check in with her at next appointment to see if she was able to utilize that co-pay card, it might just be that she had a no co-pay card that expired, today we put 15 units in each masseter, we did inject frontalis and corrugators and procerus, we did not inject the orbicularis and oculi did not do that next time either, we put more Botox in the right trap. 02/20/2020: frequency is still greater than 75% improved in both frequency and severity, also thinks the Ajovy additionally helps. (She is a sloe metabolizer did not put any in frontalis, masseters or corrugator or procerus because still there)Patient continues to do incredibly well.    Prior and next appointment: +bilateral masseters(see below), +5 units orbicularis oculi,  did extra around the procerus(5U then 5U below) and a little lower for migraines between the eyes. 10U left masseter, 5U right masseter  Start taking a few days prior to menses, once daily, and continue for 4-5 days to prevent menstrually-related migraines. If she likes we can prescribe  No orders of the defined types were placed in this encounter.    10/31/2019: Still incredible improvement. +a. She did even better than last time time, she didn't have to take all her maxalt again . The frequency is still > 75% improved in both frequency and severity. Also she has a lot of tension at the temples when she clenches, we sent her to dry needling but she did so well on the botox she didn't even go. Also loves the Ajovy. , did extra around the procerus(5U then 5U below) and a little lower for migraines between the eyes. 10U left ,asseter, 5U right masseter  Reviewed orally with patient, additionally signature is on file:  Botulism toxin has been approved by the Federal drug  administration for treatment of chronic migraine. Botulism toxin does not cure chronic migraine and it may not be effective in some patients.  The administration of botulism toxin is accomplished by injecting a small amount of toxin into the muscles of the neck and head. Dosage must be titrated for each individual. Any benefits resulting from botulism toxin tend to wear off after 3 months with a repeat injection required if benefit is to be maintained. Injections are usually done every 3-4 months with maximum effect peak achieved by about 2 or 3 weeks. Botulism toxin is expensive and you should be sure of what costs you will incur resulting from the injection.  The side effects of botulism toxin use for chronic migraine may include:   -Transient, and usually mild, facial weakness with facial injections  -Transient, and usually mild, head or neck weakness with head/neck injections  -Reduction or loss of forehead facial animation due to forehead muscle weakness  -Eyelid drooping  -Dry eye  -Pain at the site of injection or bruising at the site of injection  -Double vision  -Potential unknown long term risks  Contraindications: You should not have Botox if you are pregnant, nursing, allergic to albumin, have an infection, skin condition, or muscle weakness at the site of the injection, or have myasthenia gravis, Lambert-Eaton syndrome, or ALS.  It is also possible that as with any injection, there may be an allergic reaction or no effect from the medication. Reduced effectiveness after repeated injections is sometimes seen  and rarely infection at the injection site may occur. All care will be taken to prevent these side effects. If therapy is given over a long time, atrophy and wasting in the muscle injected may occur. Occasionally the patient's become refractory to treatment because they develop antibodies to the toxin. In this event, therapy needs to be modified.  I have read the above information  and consent to the administration of botulism toxin.    BOTOX PROCEDURE NOTE FOR MIGRAINE HEADACHE    Contraindications and precautions discussed with patient(above). Aseptic procedure was observed and patient tolerated procedure. Procedure performed by Dr. Georgia Dom  The condition has existed for more than 6 months, and pt does not have a diagnosis of ALS, Myasthenia Gravis or Lambert-Eaton Syndrome.  Risks and benefits of injections discussed and pt agrees to proceed with the procedure.  Written consent obtained  These injections are medically necessary. Pt  receives good benefits from these injections. These injections do not cause sedations or hallucinations which the oral therapies may cause.  Description of procedure:  The patient was placed in a sitting position. The standard protocol was used for Botox as follows, with 5 units of Botox injected at each site:   -Procerus muscle, midline injection  -Corrugator muscle, bilateral injection  -Frontalis muscle, bilateral injection, with 2 sites each side, medial injection was performed in the upper one third of the frontalis muscle, in the region vertical from the medial inferior edge of the superior orbital rim. The lateral injection was again in the upper one third of the forehead vertically above the lateral limbus of the cornea, 1.5 cm lateral to the medial injection site.  -Temporalis muscle injection, 4 sites, bilaterally. The first injection was 3 cm above the tragus of the ear, second injection site was 1.5 cm to 3 cm up from the first injection site in line with the tragus of the ear. The third injection site was 1.5-3 cm forward between the first 2 injection sites. The fourth injection site was 1.5 cm posterior to the second injection site.   -Occipitalis muscle injection, 3 sites, bilaterally. The first injection was done one half way between the occipital protuberance and the tip of the mastoid process behind the ear. The  second injection site was done lateral and superior to the first, 1 fingerbreadth from the first injection. The third injection site was 1 fingerbreadth superiorly and medially from the first injection site.  -Cervical paraspinal muscle injection, 2 sites, bilateral knee first injection site was 1 cm from the midline of the cervical spine, 3 cm inferior to the lower border of the occipital protuberance. The second injection site was 1.5 cm superiorly and laterally to the first injection site.  -Trapezius muscle injection was performed at 3 sites, bilaterally. The first injection site was in the upper trapezius muscle halfway between the inflection point of the neck, and the acromion. The second injection site was one half way between the acromion and the first injection site. The third injection was done between the first injection site and the inflection point of the neck.   Will return for repeat injection in 3 months.   200 units of Botox was used, any Botox not injected was wasted. The patient tolerated the procedure well, there were no complications of the above procedure.

## 2020-05-27 NOTE — Progress Notes (Signed)
Botox 100 units, 2 vials. Total 200 units.  [place] [id number] Lot [id number] Exp 2024/05  Dx: [id number] B/B

## 2020-05-28 DIAGNOSIS — K5900 Constipation, unspecified: Secondary | ICD-10-CM | POA: Diagnosis not present

## 2020-05-28 DIAGNOSIS — K219 Gastro-esophageal reflux disease without esophagitis: Secondary | ICD-10-CM | POA: Diagnosis not present

## 2020-05-28 DIAGNOSIS — R1033 Periumbilical pain: Secondary | ICD-10-CM | POA: Diagnosis not present

## 2020-05-28 DIAGNOSIS — Z1211 Encounter for screening for malignant neoplasm of colon: Secondary | ICD-10-CM | POA: Diagnosis not present

## 2020-05-29 ENCOUNTER — Other Ambulatory Visit (HOSPITAL_BASED_OUTPATIENT_CLINIC_OR_DEPARTMENT_OTHER): Payer: Self-pay | Admitting: Gastroenterology

## 2020-06-01 ENCOUNTER — Other Ambulatory Visit (HOSPITAL_BASED_OUTPATIENT_CLINIC_OR_DEPARTMENT_OTHER): Payer: Self-pay

## 2020-06-01 MED FILL — Sod Picosulfate-Mg Ox-Citric Ac Sol 10 MG-3.5 GM-12 GM/160ML: ORAL | 1 days supply | Qty: 320 | Fill #0 | Status: AC

## 2020-06-06 ENCOUNTER — Other Ambulatory Visit (HOSPITAL_BASED_OUTPATIENT_CLINIC_OR_DEPARTMENT_OTHER): Payer: Self-pay

## 2020-06-12 ENCOUNTER — Other Ambulatory Visit (HOSPITAL_BASED_OUTPATIENT_CLINIC_OR_DEPARTMENT_OTHER): Payer: Self-pay

## 2020-06-12 DIAGNOSIS — K635 Polyp of colon: Secondary | ICD-10-CM | POA: Diagnosis not present

## 2020-06-12 DIAGNOSIS — D124 Benign neoplasm of descending colon: Secondary | ICD-10-CM | POA: Diagnosis not present

## 2020-06-12 DIAGNOSIS — Z1211 Encounter for screening for malignant neoplasm of colon: Secondary | ICD-10-CM | POA: Diagnosis not present

## 2020-06-12 MED ORDER — LINZESS 290 MCG PO CAPS
ORAL_CAPSULE | ORAL | 4 refills | Status: AC
  Filled 2020-06-12: qty 90, 90d supply, fill #0
  Filled 2020-09-03 (×2): qty 90, 90d supply, fill #1
  Filled 2020-12-30: qty 90, 90d supply, fill #2
  Filled 2021-05-05: qty 90, 90d supply, fill #3

## 2020-06-20 ENCOUNTER — Other Ambulatory Visit (HOSPITAL_BASED_OUTPATIENT_CLINIC_OR_DEPARTMENT_OTHER): Payer: Self-pay

## 2020-06-28 ENCOUNTER — Other Ambulatory Visit (HOSPITAL_BASED_OUTPATIENT_CLINIC_OR_DEPARTMENT_OTHER): Payer: Self-pay

## 2020-07-01 ENCOUNTER — Other Ambulatory Visit (HOSPITAL_BASED_OUTPATIENT_CLINIC_OR_DEPARTMENT_OTHER): Payer: Self-pay

## 2020-07-01 MED ORDER — RIZATRIPTAN BENZOATE 10 MG PO TABS
10.0000 mg | ORAL_TABLET | Freq: Every day | ORAL | 0 refills | Status: DC | PRN
  Filled 2020-07-01: qty 6, 30d supply, fill #0
  Filled 2020-09-03: qty 6, 10d supply, fill #0
  Filled 2020-09-03: qty 6, 30d supply, fill #0

## 2020-07-02 ENCOUNTER — Other Ambulatory Visit (HOSPITAL_BASED_OUTPATIENT_CLINIC_OR_DEPARTMENT_OTHER): Payer: Self-pay

## 2020-07-03 DIAGNOSIS — E049 Nontoxic goiter, unspecified: Secondary | ICD-10-CM | POA: Diagnosis not present

## 2020-07-05 ENCOUNTER — Other Ambulatory Visit (HOSPITAL_BASED_OUTPATIENT_CLINIC_OR_DEPARTMENT_OTHER): Payer: Self-pay

## 2020-07-09 ENCOUNTER — Other Ambulatory Visit (HOSPITAL_BASED_OUTPATIENT_CLINIC_OR_DEPARTMENT_OTHER): Payer: Self-pay

## 2020-09-03 ENCOUNTER — Ambulatory Visit: Payer: 59 | Admitting: Neurology

## 2020-09-03 ENCOUNTER — Other Ambulatory Visit (HOSPITAL_BASED_OUTPATIENT_CLINIC_OR_DEPARTMENT_OTHER): Payer: Self-pay

## 2020-09-03 ENCOUNTER — Other Ambulatory Visit (HOSPITAL_COMMUNITY): Payer: Self-pay

## 2020-09-03 MED ORDER — LEVOTHYROXINE SODIUM 25 MCG PO TABS
25.0000 ug | ORAL_TABLET | Freq: Every morning | ORAL | 4 refills | Status: DC
  Filled 2020-09-03: qty 60, 84d supply, fill #0
  Filled 2020-09-03: qty 60, 60d supply, fill #0
  Filled 2020-12-30: qty 60, 60d supply, fill #1
  Filled 2021-05-05: qty 60, 60d supply, fill #2
  Filled 2021-07-29: qty 50, 70d supply, fill #3
  Filled 2021-07-29 – 2021-09-01 (×2): qty 60, 60d supply, fill #3

## 2020-10-02 DIAGNOSIS — E049 Nontoxic goiter, unspecified: Secondary | ICD-10-CM | POA: Diagnosis not present

## 2020-10-09 DIAGNOSIS — E049 Nontoxic goiter, unspecified: Secondary | ICD-10-CM | POA: Diagnosis not present

## 2020-10-21 ENCOUNTER — Telehealth: Payer: Self-pay | Admitting: Neurology

## 2020-10-21 NOTE — Telephone Encounter (Signed)
Patient has a Botox appointment on 8/30. Insurance [facility]) requires PA for Botox. I called [facility] at [phone] and spoke with Sharee Pimple to initiate case for CPT J0585/G43.709. Sharee Pimple advised me to fax records to [phone]. Pending reference #[id number].

## 2020-10-29 ENCOUNTER — Ambulatory Visit (INDEPENDENT_AMBULATORY_CARE_PROVIDER_SITE_OTHER): Payer: 59 | Admitting: Neurology

## 2020-10-29 DIAGNOSIS — G43709 Chronic migraine without aura, not intractable, without status migrainosus: Secondary | ICD-10-CM

## 2020-10-29 NOTE — Progress Notes (Signed)
Botox- 200 units x 1 vial Lot: [id number] Expiration: 04/2023 NDC: [id number]  Bacteriostatic 0.9% Sodium Chloride- 90m total Lot: [id number]: 03/02/2022 NDC: [id number] Dx: [id number]

## 2020-10-29 NOTE — Telephone Encounter (Signed)
Received approval from [facility]. PA WN:[id number] (10/21/20- 10/21/21).

## 2020-10-29 NOTE — Progress Notes (Signed)
Consent Form Botulism Toxin Injection For Chronic Migraine   10/29/2020:Stable, doin great on botox, not on Ajovy or cgrp*. >50% improvement in migraine frequency  05/27/2020: Patient continues to do excellent, insurance will not pay for her Ajovy I gave her the new co-pay card in 3 months of samples will check in with her at next appointment to see if she was able to utilize that co-pay card, it might just be that she had a no co-pay card that expired, today we put 10 units in each masseter, we did inject frontalis and corrugators and procerus, we did not inject the orbicularis oculi, we put more Botox in the right trap.    02/20/2020: frequency is still greater than 75% improved in both frequency and severity, also thinks the Ajovy additionally helps. (She is a sloe metabolizer did not put any in frontalis, masseters or corrugator or procerus because still there)Patient continues to do incredibly well. Reviewed orally with patient, additionally signature is on file:  Botulism toxin has been approved by the Federal drug administration for treatment of chronic migraine. Botulism toxin does not cure chronic migraine and it may not be effective in some patients.  The administration of botulism toxin is accomplished by injecting a small amount of toxin into the muscles of the neck and head. Dosage must be titrated for each individual. Any benefits resulting from botulism toxin tend to wear off after 3 months with a repeat injection required if benefit is to be maintained. Injections are usually done every 3-4 months with maximum effect peak achieved by about 2 or 3 weeks. Botulism toxin is expensive and you should be sure of what costs you will incur resulting from the injection.  The side effects of botulism toxin use for chronic migraine may include:   -Transient, and usually mild, facial weakness with facial injections  -Transient, and usually mild, head or neck weakness with head/neck  injections  -Reduction or loss of forehead facial animation due to forehead muscle weakness  -Eyelid drooping  -Dry eye  -Pain at the site of injection or bruising at the site of injection  -Double vision  -Potential unknown long term risks  Contraindications: You should not have Botox if you are pregnant, nursing, allergic to albumin, have an infection, skin condition, or muscle weakness at the site of the injection, or have myasthenia gravis, Lambert-Eaton syndrome, or ALS.  It is also possible that as with any injection, there may be an allergic reaction or no effect from the medication. Reduced effectiveness after repeated injections is sometimes seen and rarely infection at the injection site may occur. All care will be taken to prevent these side effects. If therapy is given over a long time, atrophy and wasting in the muscle injected may occur. Occasionally the patient's become refractory to treatment because they develop antibodies to the toxin. In this event, therapy needs to be modified.  I have read the above information and consent to the administration of botulism toxin.    BOTOX PROCEDURE NOTE FOR MIGRAINE HEADACHE    Contraindications and precautions discussed with patient(above). Aseptic procedure was observed and patient tolerated procedure. Procedure performed by Dr. Georgia Dom  The condition has existed for more than 6 months, and pt does not have a diagnosis of ALS, Myasthenia Gravis or Lambert-Eaton Syndrome.  Risks and benefits of injections discussed and pt agrees to proceed with the procedure.  Written consent obtained  These injections are medically necessary. Pt  receives good benefits from these injections.  These injections do not cause sedations or hallucinations which the oral therapies may cause.  Description of procedure:  The patient was placed in a sitting position. The standard protocol was used for Botox as follows, with 5 units of Botox injected at each  site:   -Procerus muscle, midline injection  -Corrugator muscle, bilateral injection  -Frontalis muscle, bilateral injection, with 2 sites each side, medial injection was performed in the upper one third of the frontalis muscle, in the region vertical from the medial inferior edge of the superior orbital rim. The lateral injection was again in the upper one third of the forehead vertically above the lateral limbus of the cornea, 1.5 cm lateral to the medial injection site.  -Temporalis muscle injection, 4 sites, bilaterally. The first injection was 3 cm above the tragus of the ear, second injection site was 1.5 cm to 3 cm up from the first injection site in line with the tragus of the ear. The third injection site was 1.5-3 cm forward between the first 2 injection sites. The fourth injection site was 1.5 cm posterior to the second injection site.   -Occipitalis muscle injection, 3 sites, bilaterally. The first injection was done one half way between the occipital protuberance and the tip of the mastoid process behind the ear. The second injection site was done lateral and superior to the first, 1 fingerbreadth from the first injection. The third injection site was 1 fingerbreadth superiorly and medially from the first injection site.  -Cervical paraspinal muscle injection, 2 sites, bilateral knee first injection site was 1 cm from the midline of the cervical spine, 3 cm inferior to the lower border of the occipital protuberance. The second injection site was 1.5 cm superiorly and laterally to the first injection site.  -Trapezius muscle injection was performed at 3 sites, bilaterally. The first injection site was in the upper trapezius muscle halfway between the inflection point of the neck, and the acromion. The second injection site was one half way between the acromion and the first injection site. The third injection was done between the first injection site and the inflection point of the  neck.   Will return for repeat injection in 3 months.   155 units of Botox was used, 45U Botox not injected was wasted. The patient tolerated the procedure well, there were no complications of the above procedure.

## 2020-10-31 ENCOUNTER — Other Ambulatory Visit (HOSPITAL_BASED_OUTPATIENT_CLINIC_OR_DEPARTMENT_OTHER): Payer: Self-pay

## 2020-10-31 DIAGNOSIS — G4719 Other hypersomnia: Secondary | ICD-10-CM | POA: Diagnosis not present

## 2020-10-31 DIAGNOSIS — F411 Generalized anxiety disorder: Secondary | ICD-10-CM | POA: Diagnosis not present

## 2020-10-31 DIAGNOSIS — Z23 Encounter for immunization: Secondary | ICD-10-CM | POA: Diagnosis not present

## 2020-10-31 DIAGNOSIS — L309 Dermatitis, unspecified: Secondary | ICD-10-CM | POA: Diagnosis not present

## 2020-10-31 MED ORDER — CLOBETASOL PROPIONATE 0.05 % EX CREA
TOPICAL_CREAM | CUTANEOUS | 5 refills | Status: AC
  Filled 2020-10-31: qty 60, 30d supply, fill #0

## 2020-11-11 ENCOUNTER — Other Ambulatory Visit (HOSPITAL_BASED_OUTPATIENT_CLINIC_OR_DEPARTMENT_OTHER): Payer: Self-pay

## 2020-11-11 DIAGNOSIS — J069 Acute upper respiratory infection, unspecified: Secondary | ICD-10-CM | POA: Diagnosis not present

## 2020-11-15 DIAGNOSIS — J0100 Acute maxillary sinusitis, unspecified: Secondary | ICD-10-CM | POA: Diagnosis not present

## 2020-12-30 ENCOUNTER — Other Ambulatory Visit (HOSPITAL_BASED_OUTPATIENT_CLINIC_OR_DEPARTMENT_OTHER): Payer: Self-pay

## 2020-12-30 ENCOUNTER — Other Ambulatory Visit (HOSPITAL_BASED_OUTPATIENT_CLINIC_OR_DEPARTMENT_OTHER): Payer: Self-pay | Admitting: Family Medicine

## 2020-12-30 DIAGNOSIS — Z1231 Encounter for screening mammogram for malignant neoplasm of breast: Secondary | ICD-10-CM

## 2021-01-01 ENCOUNTER — Other Ambulatory Visit (HOSPITAL_BASED_OUTPATIENT_CLINIC_OR_DEPARTMENT_OTHER): Payer: Self-pay

## 2021-01-01 MED ORDER — RIZATRIPTAN BENZOATE 10 MG PO TABS
ORAL_TABLET | ORAL | 1 refills | Status: DC
  Filled 2021-01-01: qty 12, 20d supply, fill #0

## 2021-01-02 ENCOUNTER — Ambulatory Visit: Payer: 59

## 2021-01-08 ENCOUNTER — Ambulatory Visit (INDEPENDENT_AMBULATORY_CARE_PROVIDER_SITE_OTHER): Payer: 59

## 2021-01-08 ENCOUNTER — Other Ambulatory Visit: Payer: Self-pay

## 2021-01-08 DIAGNOSIS — Z1231 Encounter for screening mammogram for malignant neoplasm of breast: Secondary | ICD-10-CM

## 2021-01-10 ENCOUNTER — Other Ambulatory Visit: Payer: Self-pay | Admitting: Family Medicine

## 2021-01-10 DIAGNOSIS — R928 Other abnormal and inconclusive findings on diagnostic imaging of breast: Secondary | ICD-10-CM

## 2021-01-24 ENCOUNTER — Other Ambulatory Visit (HOSPITAL_BASED_OUTPATIENT_CLINIC_OR_DEPARTMENT_OTHER): Payer: Self-pay

## 2021-01-29 ENCOUNTER — Other Ambulatory Visit (HOSPITAL_BASED_OUTPATIENT_CLINIC_OR_DEPARTMENT_OTHER): Payer: Self-pay

## 2021-01-29 MED ORDER — CLONAZEPAM 0.5 MG PO TABS
ORAL_TABLET | ORAL | 0 refills | Status: DC
  Filled 2021-01-29 – 2021-03-18 (×3): qty 30, 30d supply, fill #0

## 2021-01-30 ENCOUNTER — Other Ambulatory Visit (HOSPITAL_BASED_OUTPATIENT_CLINIC_OR_DEPARTMENT_OTHER): Payer: Self-pay

## 2021-02-04 ENCOUNTER — Ambulatory Visit: Payer: 59 | Admitting: Neurology

## 2021-02-04 DIAGNOSIS — G43709 Chronic migraine without aura, not intractable, without status migrainosus: Secondary | ICD-10-CM

## 2021-02-04 MED ORDER — EMGALITY 120 MG/ML ~~LOC~~ SOAJ: 120.0000 mg | SUBCUTANEOUS | 11 refills | Status: DC

## 2021-02-04 NOTE — Progress Notes (Signed)
Botox- 200 units x 1 vial Lot: [id number] Expiration: 07/2023 NDC: 0940-0050-56  Bacteriostatic 0.9% Sodium Chloride- 4mL total Lot: [id number] Expiration: 03/02/2022 NDC: 8826-6664-86  Dx: N61.224 B/B

## 2021-02-04 NOTE — Progress Notes (Signed)
Consent Form Botulism Toxin Injection For Chronic Migraine   02/04/2021:Stable, doin great on botox, not on Ajovy*. frequency is still greater than 75% improved in both frequency and severity,for the rest of the migraines we will see if insurance will pay for emgality. Maxalt works well   Botulism toxin has been approved by the [employer] drug administration for treatment of chronic migraine. Botulism toxin does not cure chronic migraine and it may not be effective in some patients.  The administration of botulism toxin is accomplished by injecting a small amount of toxin into the muscles of the neck and head. Dosage must be titrated for each individual. Any benefits resulting from botulism toxin tend to wear off after 3 months with a repeat injection required if benefit is to be maintained. Injections are usually done every 3-4 months with maximum effect peak achieved by about 2 or 3 weeks. Botulism toxin is expensive and you should be sure of what costs you will incur resulting from the injection.  The side effects of botulism toxin use for chronic migraine may include:   -Transient, and usually mild, facial weakness with facial injections  -Transient, and usually mild, head or neck weakness with head/neck injections  -Reduction or loss of forehead facial animation due to forehead muscle weakness  -Eyelid drooping  -Dry eye  -Pain at the site of injection or bruising at the site of injection  -Double vision  -Potential unknown long term risks  Contraindications: You should not have Botox if you are pregnant, nursing, allergic to albumin, have an infection, skin condition, or muscle weakness at the site of the injection, or have myasthenia gravis, Lambert-Eaton syndrome, or ALS.  It is also possible that as with any injection, there may be an allergic reaction or no effect from the medication. Reduced effectiveness after repeated injections is sometimes seen and rarely infection at the injection  site may occur. All care will be taken to prevent these side effects. If therapy is given over a long time, atrophy and wasting in the muscle injected may occur. Occasionally the patient's become refractory to treatment because they develop antibodies to the toxin. In this event, therapy needs to be modified.  I have read the above information and consent to the administration of botulism toxin.    BOTOX PROCEDURE NOTE FOR MIGRAINE HEADACHE    Contraindications and precautions discussed with patient(above). Aseptic procedure was observed and patient tolerated procedure. Procedure performed by Dr. Georgia Dom  The condition has existed for more than 6 months, and pt does not have a diagnosis of ALS, Myasthenia Gravis or Lambert-Eaton Syndrome.  Risks and benefits of injections discussed and pt agrees to proceed with the procedure.  Written consent obtained  These injections are medically necessary. Pt  receives good benefits from these injections. These injections do not cause sedations or hallucinations which the oral therapies may cause.  Description of procedure:  The patient was placed in a sitting position. The standard protocol was used for Botox as follows, with 5 units of Botox injected at each site:   -Procerus muscle, midline injection  -Corrugator muscle, bilateral injection  -Frontalis muscle, bilateral injection, with 2 sites each side, medial injection was performed in the upper one third of the frontalis muscle, in the region vertical from the medial inferior edge of the superior orbital rim. The lateral injection was again in the upper one third of the forehead vertically above the lateral limbus of the cornea, 1.5 cm lateral to the medial injection  site.  -Temporalis muscle injection, 4 sites, bilaterally. The first injection was 3 cm above the tragus of the ear, second injection site was 1.5 cm to 3 cm up from the first injection site in line with the tragus of the ear. The  third injection site was 1.5-3 cm forward between the first 2 injection sites. The fourth injection site was 1.5 cm posterior to the second injection site.   -Occipitalis muscle injection, 3 sites, bilaterally. The first injection was done one half way between the occipital protuberance and the tip of the mastoid process behind the ear. The second injection site was done lateral and superior to the first, 1 fingerbreadth from the first injection. The third injection site was 1 fingerbreadth superiorly and medially from the first injection site.  -Cervical paraspinal muscle injection, 2 sites, bilateral knee first injection site was 1 cm from the midline of the cervical spine, 3 cm inferior to the lower border of the occipital protuberance. The second injection site was 1.5 cm superiorly and laterally to the first injection site.  -Trapezius muscle injection was performed at 3 sites, bilaterally. The first injection site was in the upper trapezius muscle halfway between the inflection point of the neck, and the acromion. The second injection site was one half way between the acromion and the first injection site. The third injection was done between the first injection site and the inflection point of the neck.   Will return for repeat injection in 3 months.   155 units of Botox was used, 45U Botox not injected was wasted. The patient tolerated the procedure well, there were no complications of the above procedure.

## 2021-02-06 ENCOUNTER — Other Ambulatory Visit (HOSPITAL_BASED_OUTPATIENT_CLINIC_OR_DEPARTMENT_OTHER): Payer: Self-pay

## 2021-02-11 ENCOUNTER — Ambulatory Visit: Payer: 59

## 2021-02-11 ENCOUNTER — Ambulatory Visit
Admission: RE | Admit: 2021-02-11 | Discharge: 2021-02-11 | Disposition: A | Discharge status: Home / Self Care | Payer: 59 | Source: Ambulatory Visit | Attending: Family Medicine | Admitting: Family Medicine

## 2021-02-11 DIAGNOSIS — R928 Other abnormal and inconclusive findings on diagnostic imaging of breast: Secondary | ICD-10-CM

## 2021-02-11 DIAGNOSIS — R922 Inconclusive mammogram: Secondary | ICD-10-CM | POA: Diagnosis not present

## 2021-02-20 ENCOUNTER — Telehealth: Payer: Self-pay

## 2021-02-20 NOTE — Telephone Encounter (Signed)
PA has been sent for PA on [employer] [phone]) Rx #: [id number] Should receive PA feedback within 5 business days [place] [phone] phone [phone] fax

## 2021-02-27 ENCOUNTER — Other Ambulatory Visit: Payer: Self-pay | Admitting: *Deleted

## 2021-02-27 NOTE — Telephone Encounter (Signed)
LMVM for pt to check is she is using nurtec (other CGRP) as insurance reason for denial.  I may have to call insurance to see if botox was another reason, (as questions on PA noted this)

## 2021-02-27 NOTE — Telephone Encounter (Signed)
I called [employer] and spoke with a representative and informed the patient only takes Nurtec acutely and not as a preventive medication.  The representative stated he would place the PA back in the rotation to be reviewed for potential approval.  No need to do appeal at this time.

## 2021-03-04 NOTE — Telephone Encounter (Signed)
Received fax from [facility] stating Emgality 120 mg has been approved through 08/19/21. Forwarded to pharmacy. Received a receipt of confirmation.

## 2021-03-10 ENCOUNTER — Other Ambulatory Visit (HOSPITAL_BASED_OUTPATIENT_CLINIC_OR_DEPARTMENT_OTHER): Payer: Self-pay

## 2021-03-10 MED ORDER — RIZATRIPTAN BENZOATE 10 MG PO TABS
ORAL_TABLET | ORAL | 2 refills | Status: DC
  Filled 2021-03-10 – 2021-03-18 (×2): qty 6, 30d supply, fill #0
  Filled 2021-04-11 – 2021-04-22 (×2): qty 6, 30d supply, fill #1
  Filled 2021-06-16: qty 6, 30d supply, fill #2

## 2021-03-17 ENCOUNTER — Other Ambulatory Visit (HOSPITAL_BASED_OUTPATIENT_CLINIC_OR_DEPARTMENT_OTHER): Payer: Self-pay

## 2021-03-18 ENCOUNTER — Other Ambulatory Visit (HOSPITAL_BASED_OUTPATIENT_CLINIC_OR_DEPARTMENT_OTHER): Payer: Self-pay

## 2021-04-11 ENCOUNTER — Other Ambulatory Visit (HOSPITAL_BASED_OUTPATIENT_CLINIC_OR_DEPARTMENT_OTHER): Payer: Self-pay

## 2021-04-14 ENCOUNTER — Other Ambulatory Visit (HOSPITAL_BASED_OUTPATIENT_CLINIC_OR_DEPARTMENT_OTHER): Payer: Self-pay

## 2021-04-14 DIAGNOSIS — Z79899 Other long term (current) drug therapy: Secondary | ICD-10-CM | POA: Diagnosis not present

## 2021-04-14 DIAGNOSIS — Z0000 Encounter for general adult medical examination without abnormal findings: Secondary | ICD-10-CM | POA: Diagnosis not present

## 2021-04-14 DIAGNOSIS — Z13220 Encounter for screening for lipoid disorders: Secondary | ICD-10-CM | POA: Diagnosis not present

## 2021-04-14 DIAGNOSIS — K5909 Other constipation: Secondary | ICD-10-CM | POA: Diagnosis not present

## 2021-04-14 DIAGNOSIS — G4726 Circadian rhythm sleep disorder, shift work type: Secondary | ICD-10-CM | POA: Diagnosis not present

## 2021-04-14 DIAGNOSIS — E559 Vitamin D deficiency, unspecified: Secondary | ICD-10-CM | POA: Diagnosis not present

## 2021-04-14 DIAGNOSIS — E039 Hypothyroidism, unspecified: Secondary | ICD-10-CM | POA: Diagnosis not present

## 2021-04-14 DIAGNOSIS — D509 Iron deficiency anemia, unspecified: Secondary | ICD-10-CM | POA: Diagnosis not present

## 2021-04-14 DIAGNOSIS — N926 Irregular menstruation, unspecified: Secondary | ICD-10-CM | POA: Diagnosis not present

## 2021-04-14 DIAGNOSIS — Z23 Encounter for immunization: Secondary | ICD-10-CM | POA: Diagnosis not present

## 2021-04-14 MED ORDER — LINZESS 290 MCG PO CAPS
ORAL_CAPSULE | ORAL | 4 refills | Status: DC
  Filled 2021-04-14: qty 90, 90d supply, fill #0

## 2021-04-14 MED ORDER — CLONAZEPAM 0.5 MG PO TABS
ORAL_TABLET | ORAL | 1 refills | Status: DC
  Filled 2021-04-14 – 2021-04-22 (×3): qty 30, 30d supply, fill #0

## 2021-04-15 ENCOUNTER — Other Ambulatory Visit (HOSPITAL_BASED_OUTPATIENT_CLINIC_OR_DEPARTMENT_OTHER): Payer: Self-pay

## 2021-04-21 ENCOUNTER — Other Ambulatory Visit (HOSPITAL_BASED_OUTPATIENT_CLINIC_OR_DEPARTMENT_OTHER): Payer: Self-pay

## 2021-04-22 ENCOUNTER — Other Ambulatory Visit (HOSPITAL_BASED_OUTPATIENT_CLINIC_OR_DEPARTMENT_OTHER): Payer: Self-pay

## 2021-04-30 ENCOUNTER — Ambulatory Visit: Payer: 59 | Admitting: Adult Health

## 2021-05-05 ENCOUNTER — Other Ambulatory Visit (HOSPITAL_BASED_OUTPATIENT_CLINIC_OR_DEPARTMENT_OTHER): Payer: Self-pay

## 2021-05-06 ENCOUNTER — Ambulatory Visit: Payer: 59 | Admitting: Neurology

## 2021-05-06 ENCOUNTER — Encounter: Payer: Self-pay | Admitting: Neurology

## 2021-05-06 DIAGNOSIS — G43709 Chronic migraine without aura, not intractable, without status migrainosus: Secondary | ICD-10-CM | POA: Diagnosis not present

## 2021-05-06 NOTE — Progress Notes (Signed)
Botox- 200 units x 1 vial ?Lot: [id number] ?Expiration: 11/2023 ?[place]: [phone] ? ?Bacteriostatic 0.9% Sodium Chloride- 40m total ?Lot: [id number]?Expiration: 03/09/2022 [place] [address] ?Dx: GQ25.956?B/B ? ?

## 2021-05-06 NOTE — Progress Notes (Signed)
Consent Form ?Botulism Toxin Injection For Chronic Migraine ? ? ?05/06/2021:Stable, doin great on botox, not on Ajovy*. frequency is still greater than 75% improved in both frequency and severity,for the rest of the migraines we will see if insurance will pay for emgality(declined). Maxalt works well ? ? ?Botulism toxin has been approved by the Federal drug administration for treatment of chronic migraine. Botulism toxin does not cure chronic migraine and it may not be effective in some patients. ? ?The administration of botulism toxin is accomplished by injecting a small amount of toxin into the muscles of the neck and head. Dosage must be titrated for each individual. Any benefits resulting from botulism toxin tend to wear off after 3 months with a repeat injection required if benefit is to be maintained. Injections are usually done every 3-4 months with maximum effect peak achieved by about 2 or 3 weeks. Botulism toxin is expensive and you should be sure of what costs you will incur resulting from the injection. ? ?The side effects of botulism toxin use for chronic migraine may include: ? ? -Transient, and usually mild, facial weakness with facial injections ? -Transient, and usually mild, head or neck weakness with head/neck injections ? -Reduction or loss of forehead facial animation due to forehead muscle weakness ? -Eyelid drooping ? -Dry eye ? -Pain at the site of injection or bruising at the site of injection ? -Double vision ? -Potential unknown long term risks ? ?Contraindications: You should not have Botox if you are pregnant, nursing, allergic to albumin, have an infection, skin condition, or muscle weakness at the site of the injection, or have myasthenia gravis, Lambert-Eaton syndrome, or ALS. ? ?It is also possible that as with any injection, there may be an allergic reaction or no effect from the medication. Reduced effectiveness after repeated injections is sometimes seen and rarely infection at the  injection site may occur. All care will be taken to prevent these side effects. If therapy is given over a long time, atrophy and wasting in the muscle injected may occur. Occasionally the patient's become refractory to treatment because they develop antibodies to the toxin. In this event, therapy needs to be modified. ? ?I have read the above information and consent to the administration of botulism toxin. ? ? ? ?BOTOX PROCEDURE NOTE FOR MIGRAINE HEADACHE ? ? ? ?Contraindications and precautions discussed with patient(above). Aseptic procedure was observed and patient tolerated procedure. Procedure performed by Dr. Georgia Dom ? ?The condition has existed for more than 6 months, and pt does not have a diagnosis of ALS, Myasthenia Gravis or Lambert-Eaton Syndrome.  Risks and benefits of injections discussed and pt agrees to proceed with the procedure.  Written consent obtained ? ?These injections are medically necessary. Pt  receives good benefits from these injections. These injections do not cause sedations or hallucinations which the oral therapies may cause. ? ?Description of procedure: ? ?The patient was placed in a sitting position. The standard protocol was used for Botox as follows, with 5 units of Botox injected at each site: ? ? ?-Procerus muscle, midline injection ? ?-Corrugator muscle, bilateral injection ? ?-Frontalis muscle, bilateral injection, with 2 sites each side, medial injection was performed in the upper one third of the frontalis muscle, in the region vertical from the medial inferior edge of the superior orbital rim. The lateral injection was again in the upper one third of the forehead vertically above the lateral limbus of the cornea, 1.5 cm lateral to the medial injection  site. ? ?-Temporalis muscle injection, 4 sites, bilaterally. The first injection was 3 cm above the tragus of the ear, second injection site was 1.5 cm to 3 cm up from the first injection site in line with the tragus of  the ear. The third injection site was 1.5-3 cm forward between the first 2 injection sites. The fourth injection site was 1.5 cm posterior to the second injection site.  ? ?-Occipitalis muscle injection, 3 sites, bilaterally. The first injection was done one half way between the occipital protuberance and the tip of the mastoid process behind the ear. The second injection site was done lateral and superior to the first, 1 fingerbreadth from the first injection. The third injection site was 1 fingerbreadth superiorly and medially from the first injection site. ? ?-Cervical paraspinal muscle injection, 2 sites, bilateral knee first injection site was 1 cm from the midline of the cervical spine, 3 cm inferior to the lower border of the occipital protuberance. The second injection site was 1.5 cm superiorly and laterally to the first injection site. ? ?-Trapezius muscle injection was performed at 3 sites, bilaterally. The first injection site was in the upper trapezius muscle halfway between the inflection point of the neck, and the acromion. The second injection site was one half way between the acromion and the first injection site. The third injection was done between the first injection site and the inflection point of the neck. ? ? ?Will return for repeat injection in 3 months. ? ? ?155 units of Botox was used, 45U Botox not injected was wasted. The patient tolerated the procedure well, there were no complications of the above procedure. ? ? ? ?

## 2021-05-09 ENCOUNTER — Other Ambulatory Visit (HOSPITAL_BASED_OUTPATIENT_CLINIC_OR_DEPARTMENT_OTHER): Payer: Self-pay

## 2021-06-16 ENCOUNTER — Other Ambulatory Visit (HOSPITAL_BASED_OUTPATIENT_CLINIC_OR_DEPARTMENT_OTHER): Payer: Self-pay

## 2021-06-17 ENCOUNTER — Other Ambulatory Visit (HOSPITAL_BASED_OUTPATIENT_CLINIC_OR_DEPARTMENT_OTHER): Payer: Self-pay

## 2021-06-18 ENCOUNTER — Other Ambulatory Visit (HOSPITAL_BASED_OUTPATIENT_CLINIC_OR_DEPARTMENT_OTHER): Payer: Self-pay

## 2021-06-18 MED ORDER — CLONAZEPAM 0.5 MG PO TABS
0.2500 mg | ORAL_TABLET | Freq: Every evening | ORAL | 0 refills | Status: DC | PRN
  Filled 2021-06-18: qty 30, 30d supply, fill #0

## 2021-07-29 ENCOUNTER — Other Ambulatory Visit (HOSPITAL_BASED_OUTPATIENT_CLINIC_OR_DEPARTMENT_OTHER): Payer: Self-pay

## 2021-07-30 ENCOUNTER — Other Ambulatory Visit (HOSPITAL_BASED_OUTPATIENT_CLINIC_OR_DEPARTMENT_OTHER): Payer: Self-pay

## 2021-07-30 MED ORDER — RIZATRIPTAN BENZOATE 10 MG PO TABS
ORAL_TABLET | ORAL | 2 refills | Status: AC
  Filled 2021-07-30: qty 18, 30d supply, fill #0
  Filled 2021-11-04: qty 6, 18d supply, fill #0
  Filled 2021-11-30: qty 6, 18d supply, fill #1
  Filled 2021-12-25: qty 6, 18d supply, fill #2

## 2021-08-05 ENCOUNTER — Ambulatory Visit: Payer: 59 | Admitting: Neurology

## 2021-08-07 ENCOUNTER — Other Ambulatory Visit (HOSPITAL_BASED_OUTPATIENT_CLINIC_OR_DEPARTMENT_OTHER): Payer: Self-pay

## 2021-08-19 ENCOUNTER — Other Ambulatory Visit: Payer: Self-pay | Admitting: Endocrinology

## 2021-08-19 DIAGNOSIS — E049 Nontoxic goiter, unspecified: Secondary | ICD-10-CM

## 2021-09-01 ENCOUNTER — Other Ambulatory Visit (HOSPITAL_BASED_OUTPATIENT_CLINIC_OR_DEPARTMENT_OTHER): Payer: Self-pay

## 2021-09-23 ENCOUNTER — Ambulatory Visit: Payer: 59 | Admitting: Neurology

## 2021-09-23 ENCOUNTER — Encounter: Payer: Self-pay | Admitting: Neurology

## 2021-09-23 ENCOUNTER — Other Ambulatory Visit (HOSPITAL_BASED_OUTPATIENT_CLINIC_OR_DEPARTMENT_OTHER): Payer: Self-pay

## 2021-09-23 DIAGNOSIS — G43709 Chronic migraine without aura, not intractable, without status migrainosus: Secondary | ICD-10-CM | POA: Diagnosis not present

## 2021-09-23 MED ORDER — ONABOTULINUMTOXINA 200 UNITS IJ SOLR
155.0000 [IU] | Freq: Once | INTRAMUSCULAR | Status: AC
  Administered 2021-09-23: 155 [IU] via INTRAMUSCULAR

## 2021-09-23 NOTE — Progress Notes (Unsigned)
Consent Form Botulism Toxin Injection For Chronic Migraine   09/23/2021:Stable, doin great on botox, not on Ajovy*. frequency is still greater than 75% improved in both frequency and severity,for the rest of the migraines we will see if insurance will pay for emgality(declined). Maxalt works well. [place], when she gets there she can let us know and we will try to help. Ajovy works better Training and development officer.   Botulism toxin has been approved by the Federal drug administration for treatment of chronic migraine. Botulism toxin does not cure chronic migraine and it may not be effective in some patients.  The administration of botulism toxin is accomplished by injecting a small amount of toxin into the muscles of the neck and head. Dosage must be titrated for each individual. Any benefits resulting from botulism toxin tend to wear off after 3 months with a repeat injection required if benefit is to be maintained. Injections are usually done every 3-4 months with maximum effect peak achieved by about 2 or 3 weeks. Botulism toxin is expensive and you should be sure of what costs you will incur resulting from the injection.  The side effects of botulism toxin use for chronic migraine may include:   -Transient, and usually mild, facial weakness with facial injections  -Transient, and usually mild, head or neck weakness with head/neck injections  -Reduction or loss of forehead facial animation due to forehead muscle weakness  -Eyelid drooping  -Dry eye  -Pain at the site of injection or bruising at the site of injection  -Double vision  -Potential unknown long term risks  Contraindications: You should not have Botox if you are pregnant, nursing, allergic to albumin, have an infection, skin condition, or muscle weakness at the site of the injection, or have myasthenia gravis, Lambert-Eaton syndrome, or ALS.  It is also possible that as with any injection, there may be  an allergic reaction or no effect from the medication. Reduced effectiveness after repeated injections is sometimes seen and rarely infection at the injection site may occur. All care will be taken to prevent these side effects. If therapy is given over a long time, atrophy and wasting in the muscle injected may occur. Occasionally the patient's become refractory to treatment because they develop antibodies to the toxin. In this event, therapy needs to be modified.  I have read the above information and consent to the administration of botulism toxin.    BOTOX PROCEDURE NOTE FOR MIGRAINE HEADACHE    Contraindications and precautions discussed with patient(above). Aseptic procedure was observed and patient tolerated procedure. Procedure performed by Dr. Georgia Dom  The condition has existed for more than 6 months, and pt does not have a diagnosis of ALS, Myasthenia Gravis or Lambert-Eaton Syndrome.  Risks and benefits of injections discussed and pt agrees to proceed with the procedure.  Written consent obtained  These injections are medically necessary. Pt  receives good benefits from these injections. These injections do not cause sedations or hallucinations which the oral therapies may cause.  Description of procedure:  The patient was placed in a sitting position. The standard protocol was used for Botox as follows, with 5 units of Botox injected at each site:   -Procerus muscle, midline injection  -Corrugator muscle, bilateral injection  -Frontalis muscle, bilateral injection, with 2 sites each side, medial injection was performed in the upper one third of the frontalis muscle, in the region vertical from the medial inferior edge of the superior orbital  rim. The lateral injection was again in the upper one third of the forehead vertically above the lateral limbus of the cornea, 1.5 cm lateral to the medial injection site.  -Temporalis muscle injection, 4 sites, bilaterally. The first  injection was 3 cm above the tragus of the ear, second injection site was 1.5 cm to 3 cm up from the first injection site in line with the tragus of the ear. The third injection site was 1.5-3 cm forward between the first 2 injection sites. The fourth injection site was 1.5 cm posterior to the second injection site.   -Occipitalis muscle injection, 3 sites, bilaterally. The first injection was done one half way between the occipital protuberance and the tip of the mastoid process behind the ear. The second injection site was done lateral and superior to the first, 1 fingerbreadth from the first injection. The third injection site was 1 fingerbreadth superiorly and medially from the first injection site.  -Cervical paraspinal muscle injection, 2 sites, bilateral knee first injection site was 1 cm from the midline of the cervical spine, 3 cm inferior to the lower border of the occipital protuberance. The second injection site was 1.5 cm superiorly and laterally to the first injection site.  -Trapezius muscle injection was performed at 3 sites, bilaterally. The first injection site was in the upper trapezius muscle halfway between the inflection point of the neck, and the acromion. The second injection site was one half way between the acromion and the first injection site. The third injection was done between the first injection site and the inflection point of the neck.   Will return for repeat injection in 3 months.   155 units of Botox was used, 45U Botox not injected was wasted. The patient tolerated the procedure well, there were no complications of the above procedure.

## 2021-09-23 NOTE — Progress Notes (Unsigned)
Botox- 200 units x 1 vial Lot: [id number] Expiration: 04/2024 NDC: 8350-7573-22  Bacteriostatic 0.9% Sodium Chloride- 48m total Lot: [id number] Expiration: 10/01/2022 NDC: [address]-80 Dx: GI21.798 B/B

## 2021-09-24 ENCOUNTER — Telehealth: Payer: Self-pay | Admitting: *Deleted

## 2021-09-24 ENCOUNTER — Other Ambulatory Visit (HOSPITAL_BASED_OUTPATIENT_CLINIC_OR_DEPARTMENT_OTHER): Payer: Self-pay

## 2021-09-24 MED ORDER — AJOVY 225 MG/1.5ML ~~LOC~~ SOAJ
225.0000 mg | SUBCUTANEOUS | 11 refills | Status: DC
  Filled 2021-09-24: qty 1.5, 30d supply, fill #0
  Filled 2021-11-04: qty 1.5, 30d supply, fill #1
  Filled 2021-11-30: qty 1.5, 30d supply, fill #2
  Filled 2022-01-02: qty 1.5, 30d supply, fill #3
  Filled 2022-02-17 – 2022-03-20 (×2): qty 1.5, 30d supply, fill #4
  Filled 2022-04-22 – 2022-05-26 (×5): qty 1.5, 30d supply, fill #5
  Filled 2022-06-24: qty 1.5, 30d supply, fill #6
  Filled 2022-07-21 – 2022-07-22 (×3): qty 1.5, 30d supply, fill #7
  Filled 2022-08-28: qty 1.5, 30d supply, fill #8
  Filled 2022-09-24: qty 1.5, 30d supply, fill #9
  Filled ????-??-??: fill #7

## 2021-09-24 NOTE — Telephone Encounter (Signed)
Hansel Feinstein Key: Mauri Reading - PA Case ID: [id number] - Rx #: [id number]   PA Ajovy complete  Waiting on Approval

## 2021-09-24 NOTE — Addendum Note (Signed)
Addended by: Gildardo Griffes on: 09/24/2021 10:20 AM   Modules accepted: Orders

## 2021-09-24 NOTE — Telephone Encounter (Signed)
Approvedtoday The request has been approved. The authorization is effective for a maximum of 6 fills from 09/24/2021 to 03/26/2022, as long as the member is enrolled in their current health plan. The request was approved with a quantity restriction. This has been approved for a max daily dosage of 0.05. A written notification letter will follow with additional details.    PA Ajovy approved   Will contact through mychart to make aware of approval

## 2021-09-25 ENCOUNTER — Other Ambulatory Visit (HOSPITAL_BASED_OUTPATIENT_CLINIC_OR_DEPARTMENT_OTHER): Payer: Self-pay

## 2021-09-26 ENCOUNTER — Other Ambulatory Visit (HOSPITAL_BASED_OUTPATIENT_CLINIC_OR_DEPARTMENT_OTHER): Payer: Self-pay

## 2021-10-01 ENCOUNTER — Ambulatory Visit
Admission: RE | Admit: 2021-10-01 | Discharge: 2021-10-01 | Disposition: A | Discharge status: Home / Self Care | Payer: 59 | Source: Ambulatory Visit | Attending: Endocrinology | Admitting: Endocrinology

## 2021-10-01 DIAGNOSIS — E049 Nontoxic goiter, unspecified: Secondary | ICD-10-CM

## 2021-10-01 DIAGNOSIS — E041 Nontoxic single thyroid nodule: Secondary | ICD-10-CM | POA: Diagnosis not present

## 2021-10-02 ENCOUNTER — Other Ambulatory Visit (HOSPITAL_BASED_OUTPATIENT_CLINIC_OR_DEPARTMENT_OTHER): Payer: Self-pay

## 2021-10-03 ENCOUNTER — Other Ambulatory Visit (HOSPITAL_BASED_OUTPATIENT_CLINIC_OR_DEPARTMENT_OTHER): Payer: Self-pay

## 2021-10-03 DIAGNOSIS — E049 Nontoxic goiter, unspecified: Secondary | ICD-10-CM | POA: Diagnosis not present

## 2021-10-08 DIAGNOSIS — E049 Nontoxic goiter, unspecified: Secondary | ICD-10-CM | POA: Diagnosis not present

## 2021-10-08 DIAGNOSIS — R635 Abnormal weight gain: Secondary | ICD-10-CM | POA: Diagnosis not present

## 2021-10-30 ENCOUNTER — Ambulatory Visit: Payer: 59 | Admitting: Neurology

## 2021-11-04 ENCOUNTER — Other Ambulatory Visit (HOSPITAL_BASED_OUTPATIENT_CLINIC_OR_DEPARTMENT_OTHER): Payer: Self-pay

## 2021-11-05 ENCOUNTER — Other Ambulatory Visit (HOSPITAL_BASED_OUTPATIENT_CLINIC_OR_DEPARTMENT_OTHER): Payer: Self-pay

## 2021-11-13 DIAGNOSIS — B372 Candidiasis of skin and nail: Secondary | ICD-10-CM | POA: Diagnosis not present

## 2021-11-14 ENCOUNTER — Other Ambulatory Visit (HOSPITAL_BASED_OUTPATIENT_CLINIC_OR_DEPARTMENT_OTHER): Payer: Self-pay

## 2021-11-14 MED ORDER — NYSTATIN 100000 UNIT/GM EX CREA
1.0000 | TOPICAL_CREAM | Freq: Three times a day (TID) | CUTANEOUS | 0 refills | Status: AC
  Filled 2021-11-14: qty 15, 7d supply, fill #0

## 2021-11-19 ENCOUNTER — Other Ambulatory Visit (HOSPITAL_COMMUNITY): Payer: Self-pay

## 2021-11-19 ENCOUNTER — Other Ambulatory Visit (HOSPITAL_BASED_OUTPATIENT_CLINIC_OR_DEPARTMENT_OTHER): Payer: Self-pay

## 2021-11-19 DIAGNOSIS — L718 Other rosacea: Secondary | ICD-10-CM | POA: Diagnosis not present

## 2021-11-19 MED ORDER — METRONIDAZOLE 0.75 % EX CREA
TOPICAL_CREAM | Freq: Two times a day (BID) | CUTANEOUS | 11 refills | Status: AC
  Filled 2021-11-19: qty 45, 30d supply, fill #0

## 2021-11-19 MED ORDER — HYDROCORTISONE 2.5 % EX CREA
TOPICAL_CREAM | Freq: Two times a day (BID) | CUTANEOUS | 3 refills | Status: AC
  Filled 2021-11-19: qty 30, 30d supply, fill #0

## 2021-11-30 ENCOUNTER — Other Ambulatory Visit (HOSPITAL_BASED_OUTPATIENT_CLINIC_OR_DEPARTMENT_OTHER): Payer: Self-pay

## 2021-12-01 ENCOUNTER — Other Ambulatory Visit (HOSPITAL_BASED_OUTPATIENT_CLINIC_OR_DEPARTMENT_OTHER): Payer: Self-pay

## 2021-12-01 MED ORDER — LEVOTHYROXINE SODIUM 25 MCG PO TABS
25.0000 ug | ORAL_TABLET | Freq: Every morning | ORAL | 4 refills | Status: AC
  Filled 2021-12-01: qty 60, 84d supply, fill #0
  Filled 2022-03-24: qty 60, 84d supply, fill #1
  Filled 2022-06-18: qty 60, 84d supply, fill #2
  Filled 2022-09-24 (×2): qty 60, 84d supply, fill #3

## 2021-12-01 MED ORDER — CLOBETASOL PROPIONATE 0.05 % EX CREA
1.0000 | TOPICAL_CREAM | Freq: Two times a day (BID) | CUTANEOUS | 1 refills | Status: AC | PRN
  Filled 2021-12-01: qty 60, 30d supply, fill #0

## 2021-12-02 ENCOUNTER — Other Ambulatory Visit (HOSPITAL_BASED_OUTPATIENT_CLINIC_OR_DEPARTMENT_OTHER): Payer: Self-pay

## 2021-12-08 ENCOUNTER — Other Ambulatory Visit (HOSPITAL_BASED_OUTPATIENT_CLINIC_OR_DEPARTMENT_OTHER): Payer: Self-pay

## 2021-12-09 ENCOUNTER — Telehealth: Payer: Self-pay

## 2021-12-09 DIAGNOSIS — G43709 Chronic migraine without aura, not intractable, without status migrainosus: Secondary | ICD-10-CM

## 2021-12-09 NOTE — Telephone Encounter (Signed)
Patient's botox PA needs a renewal.   Toxin: Botox 200 units  CPT T7711 Dx code: A57.903  Provider: Dr. Sarina Ill  Can you confirm B/B or SP?

## 2021-12-11 ENCOUNTER — Other Ambulatory Visit (HOSPITAL_COMMUNITY): Payer: Self-pay

## 2021-12-11 NOTE — Telephone Encounter (Signed)
Patient Advocate Encounter   Received notification that prior authorization for Botox 200UNIT solution is required.   PA submitted on 12/11/2021 Key [id number] Status is pending       Lyndel Safe, [place] Patient Advocate Specialist [place] Patient Advocate Team Direct Number: [phone]  Fax: [phone]

## 2021-12-16 ENCOUNTER — Telehealth: Payer: Self-pay | Admitting: Neurology

## 2021-12-16 ENCOUNTER — Other Ambulatory Visit (HOSPITAL_COMMUNITY): Payer: Self-pay

## 2021-12-16 MED ORDER — BOTOX 100 UNITS IJ SOLR
INTRAMUSCULAR | 3 refills | Status: AC
  Filled 2021-12-16 – 2022-03-13 (×2): qty 2, 84d supply, fill #0

## 2021-12-16 NOTE — Telephone Encounter (Signed)
See phone note

## 2021-12-16 NOTE — Telephone Encounter (Signed)
Unable to LVM due to VM box being full, sent mychart msg informing pt of need to reschedule 10/18 appointment due to awaiting PA from insurance for botox

## 2021-12-16 NOTE — Telephone Encounter (Signed)
We will reschedule patient and request expedited fill from [facility] SP.

## 2021-12-16 NOTE — Telephone Encounter (Signed)
Botox PA was approved by [employer]: "The request has been approved. The authorization is effective from 12/15/2021 to 12/15/2022, as long as the member is enrolled in their current health plan. The request was reviewed and approved by a licensed clinical pharmacist. This request is approved for 1 of the 200 unit vials every 3 months.An additional authorization has been entered for Botox 100 units with a quantity limit of 2vials per 3 months, effective 12/15/2021 through 12/15/2022, please referenceauthorization [id number].This medication must be filled at [facility]. Please call [phone] for assistance. A written notification letter will follow with additional details."

## 2021-12-16 NOTE — Telephone Encounter (Signed)
This patient's botox PA was approved. Looks like we were using B/B for her but should we now use SP through [facility]?  Her appointment is tomorrow.

## 2021-12-17 ENCOUNTER — Ambulatory Visit: Payer: 59 | Admitting: Neurology

## 2021-12-22 ENCOUNTER — Other Ambulatory Visit (HOSPITAL_COMMUNITY): Payer: Self-pay

## 2021-12-24 ENCOUNTER — Other Ambulatory Visit (HOSPITAL_BASED_OUTPATIENT_CLINIC_OR_DEPARTMENT_OTHER): Payer: Self-pay | Admitting: Family Medicine

## 2021-12-24 ENCOUNTER — Other Ambulatory Visit (HOSPITAL_BASED_OUTPATIENT_CLINIC_OR_DEPARTMENT_OTHER): Payer: Self-pay

## 2021-12-24 DIAGNOSIS — Z1231 Encounter for screening mammogram for malignant neoplasm of breast: Secondary | ICD-10-CM

## 2021-12-25 ENCOUNTER — Other Ambulatory Visit (HOSPITAL_BASED_OUTPATIENT_CLINIC_OR_DEPARTMENT_OTHER): Payer: Self-pay

## 2021-12-29 ENCOUNTER — Other Ambulatory Visit (HOSPITAL_COMMUNITY): Payer: Self-pay

## 2021-12-29 ENCOUNTER — Inpatient Hospital Stay (HOSPITAL_BASED_OUTPATIENT_CLINIC_OR_DEPARTMENT_OTHER): Admission: RE | Admit: 2021-12-29 | Payer: 59 | Source: Ambulatory Visit

## 2021-12-29 DIAGNOSIS — L700 Acne vulgaris: Secondary | ICD-10-CM | POA: Diagnosis not present

## 2021-12-29 DIAGNOSIS — L718 Other rosacea: Secondary | ICD-10-CM | POA: Diagnosis not present

## 2021-12-29 MED ORDER — DOXYCYCLINE HYCLATE 100 MG PO CAPS
100.0000 mg | ORAL_CAPSULE | Freq: Two times a day (BID) | ORAL | 0 refills | Status: AC
  Filled 2021-12-29 – 2022-01-02 (×2): qty 28, 14d supply, fill #0

## 2021-12-29 MED ORDER — SPIRONOLACTONE 50 MG PO TABS
50.0000 mg | ORAL_TABLET | Freq: Two times a day (BID) | ORAL | 5 refills | Status: AC
  Filled 2021-12-29 – 2022-01-02 (×2): qty 60, 30d supply, fill #0

## 2021-12-30 ENCOUNTER — Other Ambulatory Visit (HOSPITAL_COMMUNITY): Payer: Self-pay

## 2022-01-01 ENCOUNTER — Other Ambulatory Visit (HOSPITAL_BASED_OUTPATIENT_CLINIC_OR_DEPARTMENT_OTHER): Payer: Self-pay

## 2022-01-02 ENCOUNTER — Other Ambulatory Visit (HOSPITAL_BASED_OUTPATIENT_CLINIC_OR_DEPARTMENT_OTHER): Payer: Self-pay

## 2022-01-02 ENCOUNTER — Other Ambulatory Visit (HOSPITAL_COMMUNITY): Payer: Self-pay

## 2022-01-05 ENCOUNTER — Other Ambulatory Visit (HOSPITAL_BASED_OUTPATIENT_CLINIC_OR_DEPARTMENT_OTHER): Payer: Self-pay

## 2022-01-14 ENCOUNTER — Other Ambulatory Visit (HOSPITAL_BASED_OUTPATIENT_CLINIC_OR_DEPARTMENT_OTHER): Payer: Self-pay

## 2022-02-02 ENCOUNTER — Other Ambulatory Visit (HOSPITAL_BASED_OUTPATIENT_CLINIC_OR_DEPARTMENT_OTHER): Payer: Self-pay

## 2022-02-04 ENCOUNTER — Other Ambulatory Visit (HOSPITAL_BASED_OUTPATIENT_CLINIC_OR_DEPARTMENT_OTHER): Payer: Self-pay

## 2022-02-04 MED ORDER — RIZATRIPTAN BENZOATE 10 MG PO TABS
10.0000 mg | ORAL_TABLET | Freq: Every day | ORAL | 5 refills | Status: DC | PRN
  Filled 2022-02-04: qty 6, 12d supply, fill #0
  Filled 2022-03-20: qty 6, 12d supply, fill #1
  Filled 2022-04-02: qty 6, 12d supply, fill #2
  Filled 2022-04-22: qty 6, 12d supply, fill #3
  Filled 2022-07-28: qty 6, 12d supply, fill #4
  Filled 2022-08-28: qty 6, 12d supply, fill #5

## 2022-02-04 MED ORDER — CLONAZEPAM 0.5 MG PO TABS
0.2500 mg | ORAL_TABLET | Freq: Every evening | ORAL | 0 refills | Status: DC | PRN
  Filled 2022-02-04: qty 30, 30d supply, fill #0

## 2022-02-05 ENCOUNTER — Other Ambulatory Visit (HOSPITAL_BASED_OUTPATIENT_CLINIC_OR_DEPARTMENT_OTHER): Payer: Self-pay

## 2022-02-06 IMAGING — MG MM DIGITAL SCREENING BILAT W/ TOMO AND CAD
8 series · 8 of 24 positions shown · non-contrast
Comparison: Previous exam(s).

CLINICAL DATA: Screening.

EXAM:
DIGITAL SCREENING BILATERAL MAMMOGRAM WITH TOMOSYNTHESIS AND CAD
TECHNIQUE: Bilateral screening digital craniocaudal and mediolateral oblique
mammograms were obtained. Bilateral screening digital breast
tomosynthesis was performed. The images were evaluated with
computer-aided detection.

[L CC synth-2D]
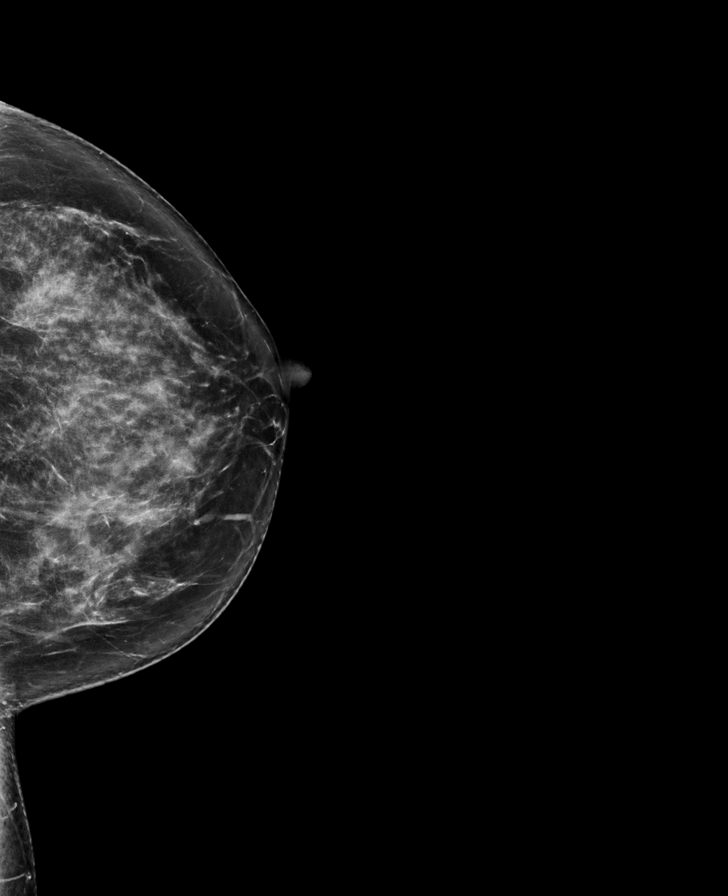

[R MLO synth-2D]
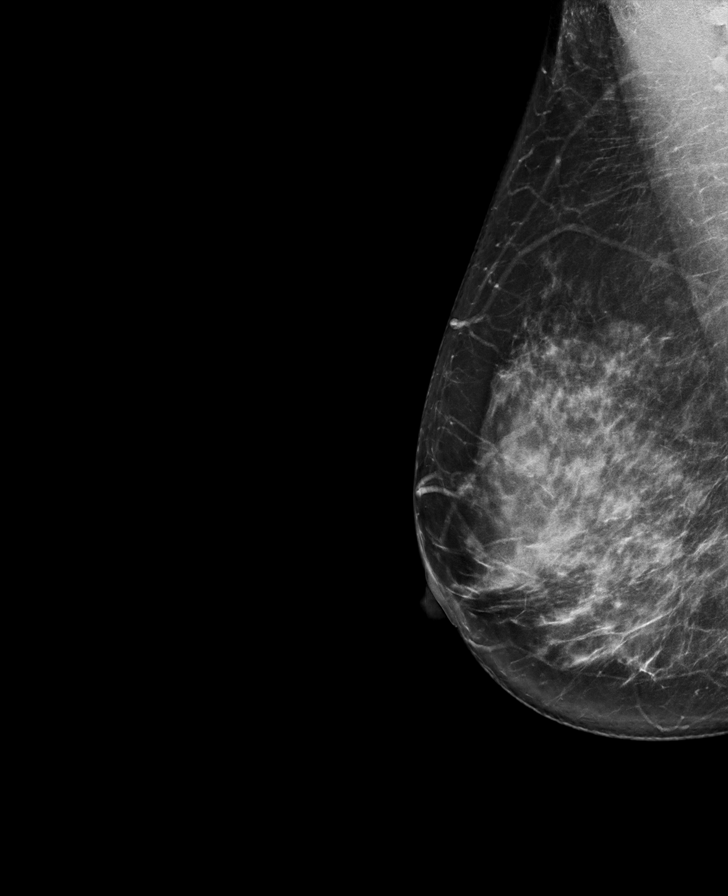

[R CC synth-2D]
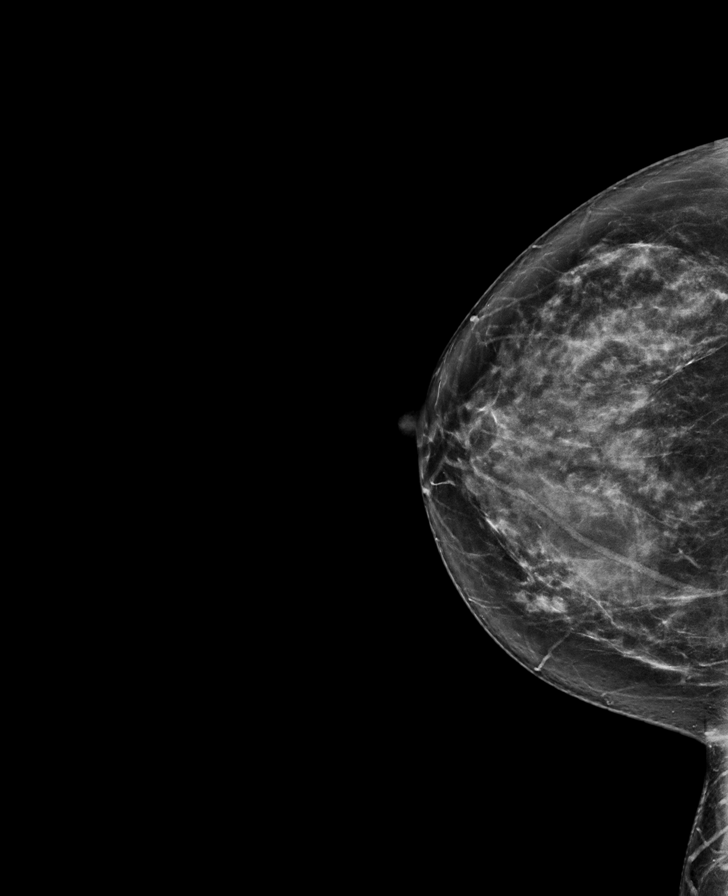

[L MLO synth-2D]
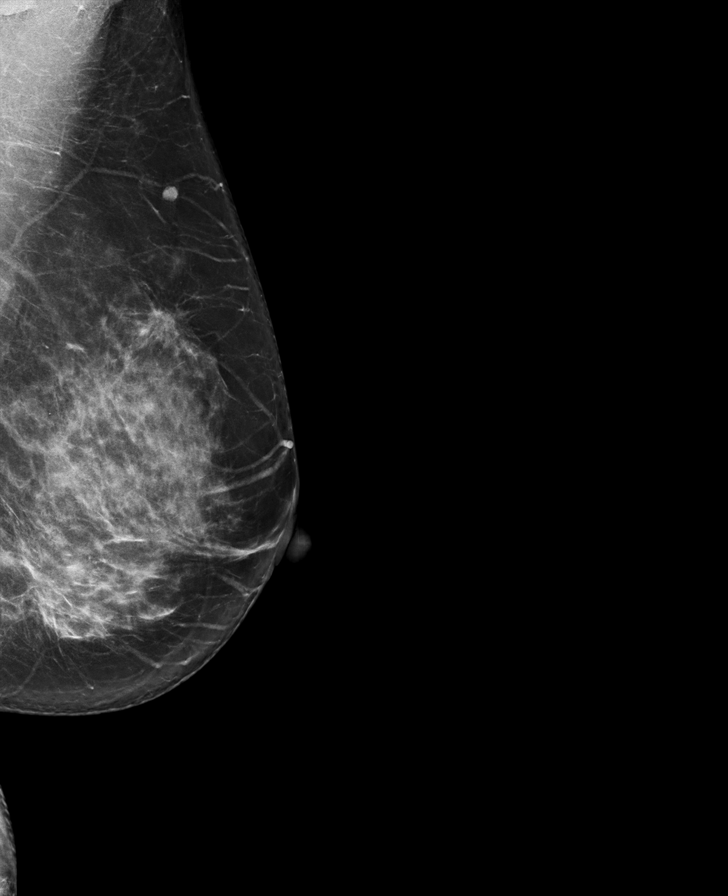

[R CC tomo · tomo slice 39/76.0]
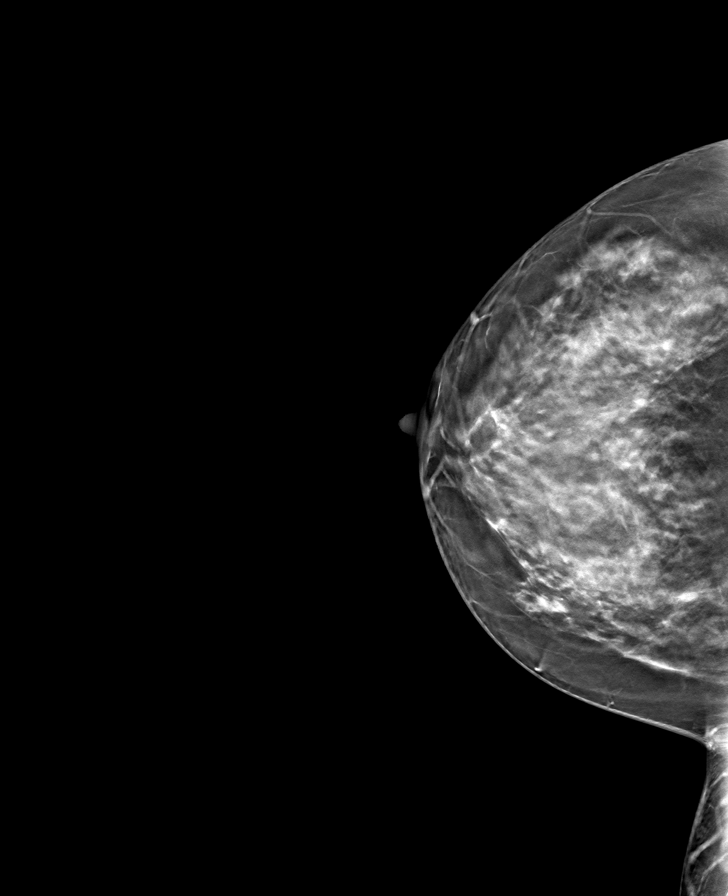

[R MLO tomo · tomo slice 40/79.0]
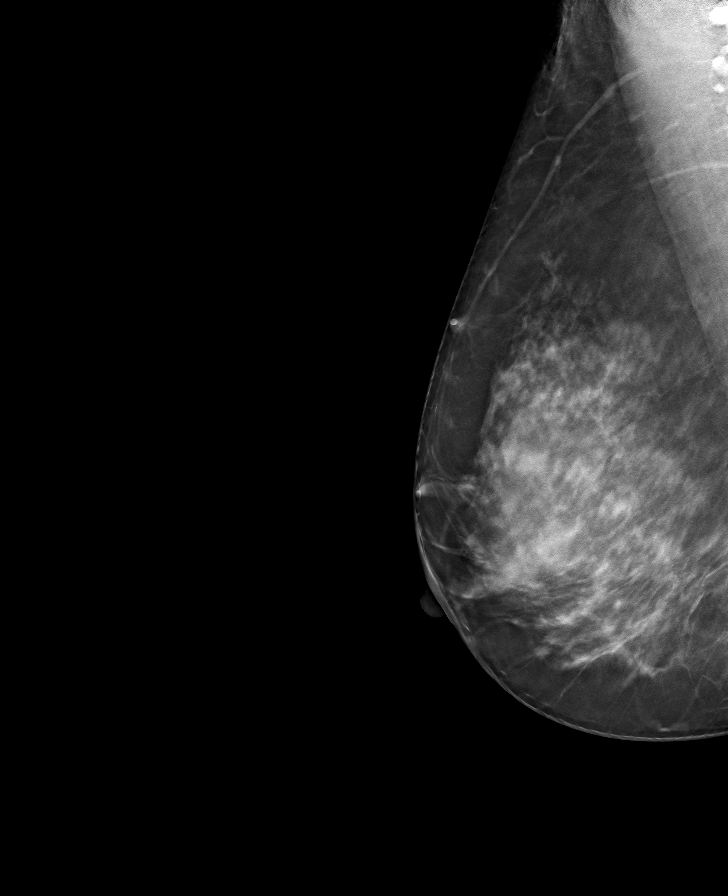

[L CC tomo · tomo slice 38/75.0]
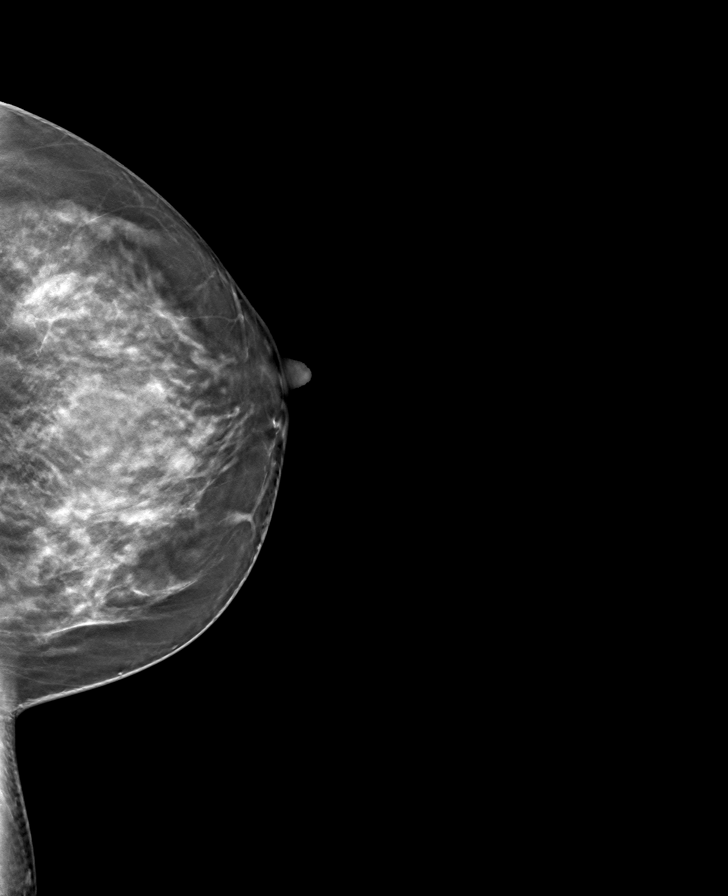

[L MLO tomo · tomo slice 43/84.0]
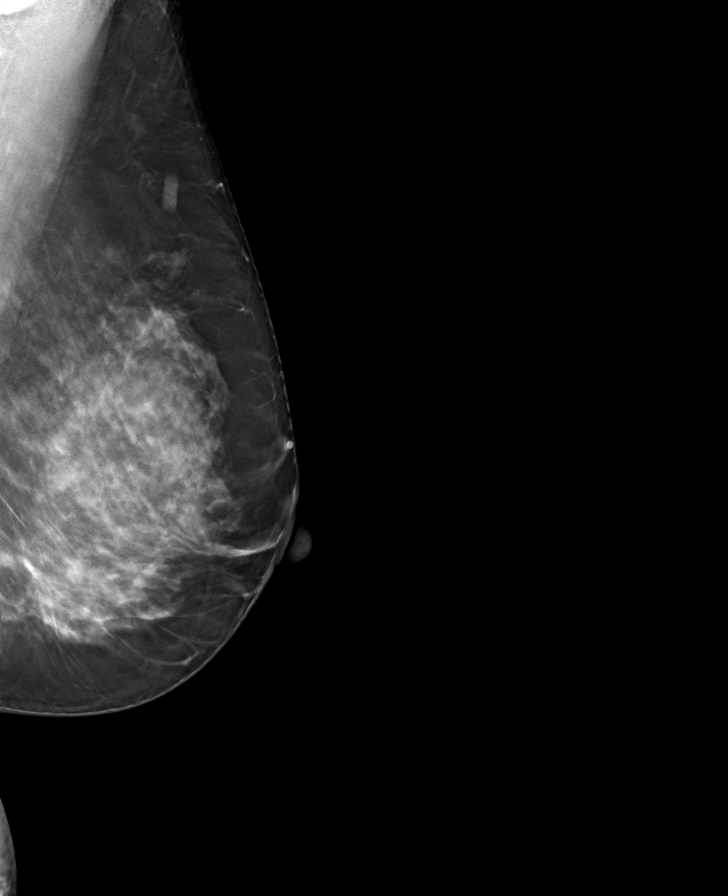

[8 of 24 positions shown; findings below may reference images not displayed]

ACR Breast Density Category d: The breast tissue is extremely dense,
which lowers the sensitivity of mammography.
FINDINGS: In the left breast, possible distortion warrants further evaluation.
In the right breast, no findings suspicious for malignancy.
IMPRESSION: Further evaluation is suggested for possible distortion in the left
breast.

RECOMMENDATION:
Diagnostic mammogram and possibly ultrasound of the left breast.
(Code:[id number])

The patient will be contacted regarding the findings, and additional
imaging will be scheduled.

BI-RADS CATEGORY  0: Incomplete. Need additional imaging evaluation
and/or prior mammograms for comparison.

## 2022-02-11 ENCOUNTER — Other Ambulatory Visit (HOSPITAL_BASED_OUTPATIENT_CLINIC_OR_DEPARTMENT_OTHER): Payer: Self-pay

## 2022-02-16 ENCOUNTER — Encounter (HOSPITAL_BASED_OUTPATIENT_CLINIC_OR_DEPARTMENT_OTHER): Payer: Self-pay

## 2022-02-16 ENCOUNTER — Ambulatory Visit (HOSPITAL_BASED_OUTPATIENT_CLINIC_OR_DEPARTMENT_OTHER): Payer: 59

## 2022-02-16 ENCOUNTER — Ambulatory Visit (HOSPITAL_BASED_OUTPATIENT_CLINIC_OR_DEPARTMENT_OTHER)
Admission: RE | Admit: 2022-02-16 | Discharge: 2022-02-16 | Disposition: A | Discharge status: Home / Self Care | Payer: 59 | Source: Ambulatory Visit | Attending: Family Medicine | Admitting: Family Medicine

## 2022-02-16 DIAGNOSIS — Z1231 Encounter for screening mammogram for malignant neoplasm of breast: Secondary | ICD-10-CM | POA: Diagnosis not present

## 2022-02-17 ENCOUNTER — Other Ambulatory Visit (HOSPITAL_BASED_OUTPATIENT_CLINIC_OR_DEPARTMENT_OTHER): Payer: Self-pay

## 2022-02-19 ENCOUNTER — Other Ambulatory Visit: Payer: Self-pay | Admitting: Family Medicine

## 2022-02-19 DIAGNOSIS — R928 Other abnormal and inconclusive findings on diagnostic imaging of breast: Secondary | ICD-10-CM

## 2022-02-24 ENCOUNTER — Other Ambulatory Visit (HOSPITAL_COMMUNITY): Payer: Self-pay

## 2022-02-24 ENCOUNTER — Ambulatory Visit: Payer: 59 | Admitting: Neurology

## 2022-02-24 NOTE — Telephone Encounter (Signed)
I called [facility] because the patient's botox was not here yet. They said the patient has a $700 co-pay and they have called the patient several times to collect the co-pay and they have not been able to get ahold of her. I left the patient a voice mail explaining this and asked her to call the pharmacy back to get this set up. The pharmacy also said they would help her get set up with a Botox co-pay card which you can get online. I told the patient I am cancelling her appointment for this afternoon and it can get rescheduled once we have her botox in the office.

## 2022-02-25 ENCOUNTER — Other Ambulatory Visit (HOSPITAL_BASED_OUTPATIENT_CLINIC_OR_DEPARTMENT_OTHER): Payer: Self-pay

## 2022-02-25 ENCOUNTER — Other Ambulatory Visit (HOSPITAL_COMMUNITY): Payer: Self-pay

## 2022-02-25 NOTE — Telephone Encounter (Signed)
Noted  

## 2022-02-26 ENCOUNTER — Other Ambulatory Visit (HOSPITAL_COMMUNITY): Payer: Self-pay

## 2022-02-27 ENCOUNTER — Other Ambulatory Visit (HOSPITAL_BASED_OUTPATIENT_CLINIC_OR_DEPARTMENT_OTHER): Payer: Self-pay

## 2022-02-27 DIAGNOSIS — U071 COVID-19: Secondary | ICD-10-CM | POA: Diagnosis not present

## 2022-02-27 MED ORDER — PAXLOVID (300/100) 20 X 150 MG & 10 X 100MG PO TBPK
ORAL_TABLET | ORAL | 0 refills | Status: AC
  Filled 2022-02-27: qty 30, 5d supply, fill #0

## 2022-03-09 ENCOUNTER — Ambulatory Visit
Admission: RE | Admit: 2022-03-09 | Discharge: 2022-03-09 | Disposition: A | Discharge status: Home / Self Care | Payer: Commercial Managed Care - PPO | Source: Ambulatory Visit | Attending: Family Medicine | Admitting: Family Medicine

## 2022-03-09 DIAGNOSIS — R928 Other abnormal and inconclusive findings on diagnostic imaging of breast: Secondary | ICD-10-CM

## 2022-03-12 ENCOUNTER — Other Ambulatory Visit: Payer: Self-pay

## 2022-03-13 ENCOUNTER — Other Ambulatory Visit (HOSPITAL_COMMUNITY): Payer: Self-pay

## 2022-03-14 ENCOUNTER — Other Ambulatory Visit (HOSPITAL_COMMUNITY): Payer: Self-pay

## 2022-03-16 ENCOUNTER — Other Ambulatory Visit: Payer: Self-pay

## 2022-03-16 NOTE — Telephone Encounter (Signed)
error 

## 2022-03-17 ENCOUNTER — Telehealth: Payer: Self-pay | Admitting: *Deleted

## 2022-03-17 NOTE — Telephone Encounter (Signed)
Pt botox 100Uvialx2 arrived at office today. Looks like her appt 02/24/22 was cx d/t pt needing to call pharmacy about copay. No appt was r/s.

## 2022-03-17 NOTE — Telephone Encounter (Signed)
Pt likely has [employer] now with [facility] and no longer UMR. I have sent her a message asking for her card so an Josem Kaufmann can be done. We have her botox but the [phone] procedure has to be approved by [facility] or else it won't be covered at next appointment.

## 2022-03-20 ENCOUNTER — Other Ambulatory Visit (HOSPITAL_BASED_OUTPATIENT_CLINIC_OR_DEPARTMENT_OTHER): Payer: Self-pay

## 2022-03-20 ENCOUNTER — Other Ambulatory Visit (HOSPITAL_COMMUNITY): Payer: Self-pay

## 2022-03-24 NOTE — Telephone Encounter (Signed)
Pt is calling. Wanting to schedule a botox appointment. I informed pt that her new insurance is needed.   GEHA  Member [id number] Group #: [id number] Rx Bin# [id number] Rx PCN: [id number] RX Group: [id number]

## 2022-03-24 NOTE — Telephone Encounter (Signed)
Chronic Migraine CPT 64615  Botox J0585 Units:200  G43.709 Chronic Migraine without aura, not intractable, without status migrainous  Need PA asap. Pt has new insurance and is past due for injection

## 2022-03-26 ENCOUNTER — Other Ambulatory Visit (HOSPITAL_COMMUNITY): Payer: Self-pay

## 2022-03-26 NOTE — Telephone Encounter (Signed)
This will have to be covered under PT medical-this is a change since insurance changed to [employer] and they faxed me the forms needed to start the process for the PA.

## 2022-04-01 NOTE — Telephone Encounter (Signed)
[facility] UMR is no longer in effect. We now have [employer]. Called the pt and LVM for her to call us back. I advised the process is started for botox approval w/ [employer].

## 2022-04-01 NOTE — Telephone Encounter (Signed)
Pt is calling. Stated she needs to talk to someone about getting PA for botox. Stated she still have both insurance and needs to schedule botox appointment. Pt is requesting a call back.

## 2022-04-02 ENCOUNTER — Other Ambulatory Visit (HOSPITAL_COMMUNITY): Payer: Self-pay

## 2022-04-02 ENCOUNTER — Other Ambulatory Visit: Payer: Self-pay

## 2022-04-02 ENCOUNTER — Other Ambulatory Visit (HOSPITAL_BASED_OUTPATIENT_CLINIC_OR_DEPARTMENT_OTHER): Payer: Self-pay

## 2022-04-02 NOTE — Telephone Encounter (Signed)
Yes please

## 2022-04-02 NOTE — Telephone Encounter (Signed)
Pt scheduled for botox injection with Jessica for 04/07/22 at 9:45am

## 2022-04-02 NOTE — Telephone Encounter (Signed)
Pt called back. Requesting a call from nurse.

## 2022-04-02 NOTE — Telephone Encounter (Addendum)
I called pt and relayed to upload her new [employer] cards.  She said she would I gave her the [employer] # to call and get her card.  PA team is looking in to it.

## 2022-04-02 NOTE — Telephone Encounter (Signed)
Pharmacy Patient Advocate Encounter  Prior Authorization for Botox 200 Units has been approved.    PA# [id number] Effective dates: 03/26/2022 through 09/24/2022 This will be Buy and Bill.

## 2022-04-03 ENCOUNTER — Other Ambulatory Visit (HOSPITAL_BASED_OUTPATIENT_CLINIC_OR_DEPARTMENT_OTHER): Payer: Self-pay

## 2022-04-03 MED ORDER — CLONAZEPAM 0.5 MG PO TABS
0.2500 mg | ORAL_TABLET | Freq: Every evening | ORAL | 0 refills | Status: AC | PRN
  Filled 2022-04-03: qty 30, 30d supply, fill #0

## 2022-04-07 ENCOUNTER — Ambulatory Visit (INDEPENDENT_AMBULATORY_CARE_PROVIDER_SITE_OTHER): Payer: Commercial Managed Care - PPO | Admitting: Adult Health

## 2022-04-07 ENCOUNTER — Encounter: Payer: Self-pay | Admitting: Adult Health

## 2022-04-07 VITALS — BP 118/81 | HR 72

## 2022-04-07 DIAGNOSIS — G43709 Chronic migraine without aura, not intractable, without status migrainosus: Secondary | ICD-10-CM

## 2022-04-07 MED ORDER — ONABOTULINUMTOXINA 200 UNITS IJ SOLR
155.0000 [IU] | Freq: Once | INTRAMUSCULAR | Status: AC
  Administered 2022-04-07: 155 [IU] via INTRAMUSCULAR

## 2022-04-07 NOTE — Progress Notes (Signed)
Botox- 200 units x 1 vial Lot: [id number] Expiration: 07/2024 NDC: 3475-8307-46  Bacteriostatic 0.9% Sodium Chloride- 13m total [id number]: 11/25 NDC: 656943-700-52 Dx: GB91.028BB

## 2022-04-07 NOTE — Progress Notes (Signed)
Update 04/07/2022 JM: Patient returns for Botox injection.  Prior injection 09/23/2021 with Dr. Jaynee Eagles.  She also continues on Ajovy monthly injection. Reports about 6-10 migraines per month, use of Relpax or Maxalt with benefit. Delay in between botox visits as she started working for the [employer] [place] back in August and had issues getting Botox.  Tolerated procedure well today.  She will return in 3 months for repeat injection.   Consent Form Botulism Toxin Injection For Chronic Migraine    Reviewed orally with patient, additionally signature is on file:  Botulism toxin has been approved by the Federal drug administration for treatment of chronic migraine. Botulism toxin does not cure chronic migraine and it may not be effective in some patients.  The administration of botulism toxin is accomplished by injecting a small amount of toxin into the muscles of the neck and head. Dosage must be titrated for each individual. Any benefits resulting from botulism toxin tend to wear off after 3 months with a repeat injection required if benefit is to be maintained. Injections are usually done every 3-4 months with maximum effect peak achieved by about 2 or 3 weeks. Botulism toxin is expensive and you should be sure of what costs you will incur resulting from the injection.  The side effects of botulism toxin use for chronic migraine may include:   -Transient, and usually mild, facial weakness with facial injections  -Transient, and usually mild, head or neck weakness with head/neck injections  -Reduction or loss of forehead facial animation due to forehead muscle weakness  -Eyelid drooping  -Dry eye  -Pain at the site of injection or bruising at the site of injection  -Double vision  -Potential unknown long term risks   Contraindications: You should not have Botox if you are pregnant, nursing, allergic to albumin, have an infection, skin condition, or muscle weakness at the site of the injection, or have  myasthenia gravis, Lambert-Eaton syndrome, or ALS.  It is also possible that as with any injection, there may be an allergic reaction or no effect from the medication. Reduced effectiveness after repeated injections is sometimes seen and rarely infection at the injection site may occur. All care will be taken to prevent these side effects. If therapy is given over a long time, atrophy and wasting in the muscle injected may occur. Occasionally the patient's become refractory to treatment because they develop antibodies to the toxin. In this event, therapy needs to be modified.  I have read the above information and consent to the administration of botulism toxin.    BOTOX PROCEDURE NOTE FOR MIGRAINE HEADACHE  Contraindications and precautions discussed with patient(above). Aseptic procedure was observed and patient tolerated procedure. Procedure performed by Frann Rider, AGNP-BC.   The condition has existed for more than 6 months, and pt does not have a diagnosis of ALS, Myasthenia Gravis or Lambert-Eaton Syndrome.  Risks and benefits of injections discussed and pt agrees to proceed with the procedure.  Written consent obtained  These injections are medically necessary. Pt  receives good benefits from these injections. These injections do not cause sedations or hallucinations which the oral therapies may cause.   Description of procedure:  The patient was placed in a sitting position. The standard protocol was used for Botox as follows, with 5 units of Botox injected at each site:  -Procerus muscle, midline injection  -Corrugator muscle, bilateral injection  -Frontalis muscle, bilateral injection, with 2 sites each side, medial injection was performed in the upper  one third of the frontalis muscle, in the region vertical from the medial inferior edge of the superior orbital rim. The lateral injection was again in the upper one third of the forehead vertically above the lateral limbus of the  cornea, 1.5 cm lateral to the medial injection site.  -Temporalis muscle injection, 4 sites, bilaterally. The first injection was 3 cm above the tragus of the ear, second injection site was 1.5 cm to 3 cm up from the first injection site in line with the tragus of the ear. The third injection site was 1.5-3 cm forward between the first 2 injection sites. The fourth injection site was 1.5 cm posterior to the second injection site. 5th site laterally in the temporalis  muscleat the level of the outer canthus.  -Occipitalis muscle injection, 3 sites, bilaterally. The first injection was done one half way between the occipital protuberance and the tip of the mastoid process behind the ear. The second injection site was done lateral and superior to the first, 1 fingerbreadth from the first injection. The third injection site was 1 fingerbreadth superiorly and medially from the first injection site.  -Cervical paraspinal muscle injection, 2 sites, bilaterally. The first injection site was 1 cm from the midline of the cervical spine, 3 cm inferior to the lower border of the occipital protuberance. The second injection site was 1.5 cm superiorly and laterally to the first injection site.  -Trapezius muscle injection was performed at 3 sites, bilaterally. The first injection site was in the upper trapezius muscle halfway between the inflection point of the neck, and the acromion. The second injection site was one half way between the acromion and the first injection site. The third injection was done between the first injection site and the inflection point of the neck.    A total of 200 units of Botox was prepared, 155 units of Botox was injected as documented above, any Botox not injected was wasted. The patient tolerated the procedure well, there were no complications of the above procedure.   Frann Rider, AGNP-BC  [facility] [address] [place], [place]  [address]  Phone [phone] Fax [phone] Note: This document was prepared with digital dictation and possible smart phrase technology. Any transcriptional errors that result from this process are unintentional.

## 2022-04-21 ENCOUNTER — Other Ambulatory Visit (HOSPITAL_BASED_OUTPATIENT_CLINIC_OR_DEPARTMENT_OTHER): Payer: Self-pay

## 2022-04-21 DIAGNOSIS — Z0000 Encounter for general adult medical examination without abnormal findings: Secondary | ICD-10-CM | POA: Diagnosis not present

## 2022-04-21 DIAGNOSIS — Z79899 Other long term (current) drug therapy: Secondary | ICD-10-CM | POA: Diagnosis not present

## 2022-04-21 DIAGNOSIS — D509 Iron deficiency anemia, unspecified: Secondary | ICD-10-CM | POA: Diagnosis not present

## 2022-04-21 DIAGNOSIS — E039 Hypothyroidism, unspecified: Secondary | ICD-10-CM | POA: Diagnosis not present

## 2022-04-21 DIAGNOSIS — E559 Vitamin D deficiency, unspecified: Secondary | ICD-10-CM | POA: Diagnosis not present

## 2022-04-21 DIAGNOSIS — Z13220 Encounter for screening for lipoid disorders: Secondary | ICD-10-CM | POA: Diagnosis not present

## 2022-04-21 DIAGNOSIS — M542 Cervicalgia: Secondary | ICD-10-CM | POA: Diagnosis not present

## 2022-04-21 MED ORDER — ACCRUFER 30 MG PO CAPS
1.0000 | ORAL_CAPSULE | Freq: Two times a day (BID) | ORAL | 2 refills | Status: AC
  Filled 2022-04-21 – 2022-05-26 (×2): qty 60, 30d supply, fill #0

## 2022-04-22 ENCOUNTER — Other Ambulatory Visit (HOSPITAL_BASED_OUTPATIENT_CLINIC_OR_DEPARTMENT_OTHER): Payer: Self-pay

## 2022-04-22 ENCOUNTER — Ambulatory Visit (HOSPITAL_BASED_OUTPATIENT_CLINIC_OR_DEPARTMENT_OTHER)
Admission: RE | Admit: 2022-04-22 | Discharge: 2022-04-22 | Disposition: A | Discharge status: Home / Self Care | Payer: No Typology Code available for payment source | Source: Ambulatory Visit | Attending: Family Medicine | Admitting: Family Medicine

## 2022-04-22 ENCOUNTER — Telehealth: Payer: Self-pay | Admitting: Adult Health

## 2022-04-22 ENCOUNTER — Other Ambulatory Visit (HOSPITAL_BASED_OUTPATIENT_CLINIC_OR_DEPARTMENT_OTHER): Payer: Self-pay | Admitting: Family Medicine

## 2022-04-22 DIAGNOSIS — M542 Cervicalgia: Secondary | ICD-10-CM | POA: Diagnosis not present

## 2022-04-22 NOTE — Telephone Encounter (Signed)
Called pt  informed her that PA was approved for Fremanezumab-vfrm (AJOVY) 225 MG/1.5ML SOAJ  and she could go pick up from pharmacy. Pt was appreciative of the call.

## 2022-04-22 NOTE — Telephone Encounter (Signed)
PA completed on CMM/medimpact [id number] Approved immediately  "request has been approved. The authorization is effective from 04/22/2022 to 04/23/2023"

## 2022-04-22 NOTE — Telephone Encounter (Signed)
Pt request refill for Fremanezumab-vfrm (AJOVY) 225 MG/1.5ML SOAJ  at [place] requesting PA for prescription.

## 2022-04-23 ENCOUNTER — Other Ambulatory Visit (HOSPITAL_BASED_OUTPATIENT_CLINIC_OR_DEPARTMENT_OTHER): Payer: Self-pay

## 2022-04-24 ENCOUNTER — Other Ambulatory Visit (HOSPITAL_BASED_OUTPATIENT_CLINIC_OR_DEPARTMENT_OTHER): Payer: Self-pay

## 2022-04-27 ENCOUNTER — Other Ambulatory Visit (HOSPITAL_BASED_OUTPATIENT_CLINIC_OR_DEPARTMENT_OTHER): Payer: Self-pay

## 2022-04-29 ENCOUNTER — Other Ambulatory Visit (HOSPITAL_BASED_OUTPATIENT_CLINIC_OR_DEPARTMENT_OTHER): Payer: Self-pay

## 2022-04-30 ENCOUNTER — Other Ambulatory Visit (HOSPITAL_BASED_OUTPATIENT_CLINIC_OR_DEPARTMENT_OTHER): Payer: Self-pay

## 2022-05-05 ENCOUNTER — Other Ambulatory Visit (HOSPITAL_COMMUNITY): Payer: Self-pay

## 2022-05-05 NOTE — Telephone Encounter (Signed)
Received a request for Add Info form [employer]-faxed form to [phone].

## 2022-05-11 ENCOUNTER — Other Ambulatory Visit (HOSPITAL_BASED_OUTPATIENT_CLINIC_OR_DEPARTMENT_OTHER): Payer: Self-pay

## 2022-05-12 ENCOUNTER — Other Ambulatory Visit (HOSPITAL_BASED_OUTPATIENT_CLINIC_OR_DEPARTMENT_OTHER): Payer: Self-pay

## 2022-05-14 ENCOUNTER — Other Ambulatory Visit (HOSPITAL_BASED_OUTPATIENT_CLINIC_OR_DEPARTMENT_OTHER): Payer: Self-pay

## 2022-05-21 ENCOUNTER — Other Ambulatory Visit (HOSPITAL_BASED_OUTPATIENT_CLINIC_OR_DEPARTMENT_OTHER): Payer: Self-pay

## 2022-05-26 ENCOUNTER — Other Ambulatory Visit (HOSPITAL_BASED_OUTPATIENT_CLINIC_OR_DEPARTMENT_OTHER): Payer: Self-pay

## 2022-06-03 ENCOUNTER — Other Ambulatory Visit (HOSPITAL_COMMUNITY): Payer: Self-pay

## 2022-06-11 ENCOUNTER — Other Ambulatory Visit (HOSPITAL_COMMUNITY): Payer: Self-pay

## 2022-06-16 ENCOUNTER — Other Ambulatory Visit (HOSPITAL_COMMUNITY): Payer: Self-pay

## 2022-06-18 ENCOUNTER — Other Ambulatory Visit (HOSPITAL_BASED_OUTPATIENT_CLINIC_OR_DEPARTMENT_OTHER): Payer: Self-pay

## 2022-06-24 ENCOUNTER — Other Ambulatory Visit (HOSPITAL_BASED_OUTPATIENT_CLINIC_OR_DEPARTMENT_OTHER): Payer: Self-pay

## 2022-06-25 ENCOUNTER — Other Ambulatory Visit (HOSPITAL_COMMUNITY): Payer: Self-pay

## 2022-06-26 ENCOUNTER — Other Ambulatory Visit (HOSPITAL_BASED_OUTPATIENT_CLINIC_OR_DEPARTMENT_OTHER): Payer: Self-pay

## 2022-06-29 ENCOUNTER — Other Ambulatory Visit (HOSPITAL_BASED_OUTPATIENT_CLINIC_OR_DEPARTMENT_OTHER): Payer: Self-pay

## 2022-06-30 ENCOUNTER — Other Ambulatory Visit (HOSPITAL_BASED_OUTPATIENT_CLINIC_OR_DEPARTMENT_OTHER): Payer: Self-pay

## 2022-07-02 ENCOUNTER — Other Ambulatory Visit (HOSPITAL_COMMUNITY): Payer: Self-pay

## 2022-07-02 ENCOUNTER — Other Ambulatory Visit (HOSPITAL_BASED_OUTPATIENT_CLINIC_OR_DEPARTMENT_OTHER): Payer: Self-pay

## 2022-07-03 ENCOUNTER — Telehealth: Payer: Commercial Managed Care - PPO | Admitting: Adult Health

## 2022-07-03 NOTE — Telephone Encounter (Signed)
Pt called stating that the pharmacy will not fill her Ajovy unless the PA has been sent in. She states she has been waiting a week and would like an update. Please advise.

## 2022-07-06 ENCOUNTER — Encounter: Payer: Self-pay | Admitting: Neurology

## 2022-07-09 ENCOUNTER — Other Ambulatory Visit (HOSPITAL_BASED_OUTPATIENT_CLINIC_OR_DEPARTMENT_OTHER): Payer: Self-pay

## 2022-07-09 ENCOUNTER — Telehealth: Payer: Self-pay | Admitting: *Deleted

## 2022-07-09 NOTE — Telephone Encounter (Signed)
Completed Ajovy on CMM. Key: [id number]. Approved immediately by [employer].

## 2022-07-21 ENCOUNTER — Other Ambulatory Visit (HOSPITAL_BASED_OUTPATIENT_CLINIC_OR_DEPARTMENT_OTHER): Payer: Self-pay

## 2022-07-22 ENCOUNTER — Other Ambulatory Visit (HOSPITAL_BASED_OUTPATIENT_CLINIC_OR_DEPARTMENT_OTHER): Payer: Self-pay

## 2022-08-05 ENCOUNTER — Other Ambulatory Visit (HOSPITAL_BASED_OUTPATIENT_CLINIC_OR_DEPARTMENT_OTHER): Payer: Self-pay

## 2022-08-06 ENCOUNTER — Other Ambulatory Visit: Payer: Self-pay

## 2022-08-06 ENCOUNTER — Other Ambulatory Visit (HOSPITAL_BASED_OUTPATIENT_CLINIC_OR_DEPARTMENT_OTHER): Payer: Self-pay

## 2022-08-06 MED ORDER — ELETRIPTAN HYDROBROMIDE 40 MG PO TABS
40.0000 mg | ORAL_TABLET | Freq: Every day | ORAL | 0 refills | Status: AC | PRN
  Filled 2022-08-06: qty 6, 6d supply, fill #0
  Filled 2023-02-25: qty 12, 25d supply, fill #0

## 2022-08-06 MED ORDER — CLONAZEPAM 0.5 MG PO TABS
0.2500 mg | ORAL_TABLET | Freq: Every day | ORAL | 0 refills | Status: AC | PRN
  Filled 2022-08-06: qty 30, 30d supply, fill #0

## 2022-08-07 ENCOUNTER — Other Ambulatory Visit (HOSPITAL_BASED_OUTPATIENT_CLINIC_OR_DEPARTMENT_OTHER): Payer: Self-pay

## 2022-08-11 ENCOUNTER — Other Ambulatory Visit (HOSPITAL_BASED_OUTPATIENT_CLINIC_OR_DEPARTMENT_OTHER): Payer: Self-pay

## 2022-08-25 ENCOUNTER — Telehealth: Payer: Self-pay

## 2022-08-25 NOTE — Telephone Encounter (Signed)
Pt currently has bad debt with our office and does not have an upcoming appointment. Will renew auth when/if she reschedules with [place].

## 2022-08-28 ENCOUNTER — Other Ambulatory Visit (HOSPITAL_BASED_OUTPATIENT_CLINIC_OR_DEPARTMENT_OTHER): Payer: Self-pay

## 2022-09-24 ENCOUNTER — Other Ambulatory Visit (HOSPITAL_BASED_OUTPATIENT_CLINIC_OR_DEPARTMENT_OTHER): Payer: Self-pay

## 2022-09-24 MED ORDER — RIZATRIPTAN BENZOATE 10 MG PO TABS
10.0000 mg | ORAL_TABLET | Freq: Every day | ORAL | 5 refills | Status: AC | PRN
  Filled 2022-09-24: qty 6, 6d supply, fill #0
  Filled 2022-12-15: qty 6, 6d supply, fill #1
  Filled 2023-02-09 – 2023-03-25 (×2): qty 6, 6d supply, fill #2

## 2022-10-07 ENCOUNTER — Other Ambulatory Visit (HOSPITAL_COMMUNITY): Payer: Self-pay

## 2022-10-07 MED ORDER — DOXYCYCLINE HYCLATE 50 MG PO CAPS
50.0000 mg | ORAL_CAPSULE | Freq: Every day | ORAL | 3 refills | Status: AC
  Filled 2022-10-07 – 2022-10-08 (×2): qty 30, 30d supply, fill #0

## 2022-10-08 ENCOUNTER — Other Ambulatory Visit: Payer: Self-pay | Admitting: Neurology

## 2022-10-08 ENCOUNTER — Other Ambulatory Visit (HOSPITAL_COMMUNITY): Payer: Self-pay

## 2022-10-08 ENCOUNTER — Other Ambulatory Visit (HOSPITAL_BASED_OUTPATIENT_CLINIC_OR_DEPARTMENT_OTHER): Payer: Self-pay

## 2022-10-08 MED ORDER — AJOVY 225 MG/1.5ML ~~LOC~~ SOAJ
225.0000 mg | SUBCUTANEOUS | 5 refills | Status: AC
  Filled 2022-10-08 – 2023-08-16 (×3): qty 1.5, 30d supply, fill #0

## 2022-10-08 NOTE — Telephone Encounter (Addendum)
Pharmacy says the voucher will pay up to $200. The patient is left with a $198 co-pay after insurance and savings voucher. Pharmacist said insurance wouldn't approve a tier reduction request.  He also says the coding was a little bit different with the last refill so he is going to look further into this to make sure he cannot get the price any lower.  He will also give the patient a call.

## 2022-10-08 NOTE — Telephone Encounter (Signed)
Pt called wanting to speak to the Provider regarding this medication. She states that her insurance is now charging her over $100 per month due to her exhausting the $15 copay. Pt states that her insurance is needing a Medical Necessity letter in order for them to continue to cover it for her. Pt states that it can be sent in as Urgent. Please advise.

## 2022-10-09 ENCOUNTER — Other Ambulatory Visit: Payer: Self-pay

## 2022-10-09 ENCOUNTER — Other Ambulatory Visit (HOSPITAL_BASED_OUTPATIENT_CLINIC_OR_DEPARTMENT_OTHER): Payer: Self-pay

## 2022-10-09 MED ORDER — FUSION PLUS PO CAPS
ORAL_CAPSULE | ORAL | 1 refills | Status: AC
  Filled 2022-10-09: qty 90, 90d supply, fill #0

## 2022-10-09 MED ORDER — CLONAZEPAM 0.5 MG PO TABS
ORAL_TABLET | ORAL | 0 refills | Status: AC
  Filled 2022-10-09: qty 30, 30d supply, fill #0

## 2022-10-12 ENCOUNTER — Other Ambulatory Visit (HOSPITAL_BASED_OUTPATIENT_CLINIC_OR_DEPARTMENT_OTHER): Payer: Self-pay

## 2022-10-15 ENCOUNTER — Other Ambulatory Visit (HOSPITAL_BASED_OUTPATIENT_CLINIC_OR_DEPARTMENT_OTHER): Payer: Self-pay

## 2022-10-15 MED ORDER — LEVOTHYROXINE SODIUM 25 MCG PO TABS
25.0000 ug | ORAL_TABLET | Freq: Every day | ORAL | 5 refills | Status: DC
  Filled 2022-12-15: qty 60, 60d supply, fill #0
  Filled 2023-02-09 – 2023-02-25 (×2): qty 60, 60d supply, fill #1
  Filled 2023-05-24: qty 60, 60d supply, fill #2
  Filled 2023-08-16 (×2): qty 60, 60d supply, fill #3
  Filled 2023-10-11: qty 60, 60d supply, fill #4

## 2022-10-21 ENCOUNTER — Other Ambulatory Visit (HOSPITAL_BASED_OUTPATIENT_CLINIC_OR_DEPARTMENT_OTHER): Payer: Self-pay

## 2022-10-27 ENCOUNTER — Other Ambulatory Visit (HOSPITAL_BASED_OUTPATIENT_CLINIC_OR_DEPARTMENT_OTHER): Payer: Self-pay

## 2022-11-05 ENCOUNTER — Other Ambulatory Visit (HOSPITAL_BASED_OUTPATIENT_CLINIC_OR_DEPARTMENT_OTHER): Payer: Self-pay

## 2022-11-17 ENCOUNTER — Other Ambulatory Visit (HOSPITAL_COMMUNITY): Payer: Self-pay | Admitting: *Deleted

## 2022-11-19 ENCOUNTER — Ambulatory Visit (HOSPITAL_COMMUNITY)
Admission: RE | Admit: 2022-11-19 | Discharge: 2022-11-19 | Disposition: A | Discharge status: Home / Self Care | Payer: No Typology Code available for payment source | Source: Ambulatory Visit | Attending: Family Medicine | Admitting: Family Medicine

## 2022-11-19 DIAGNOSIS — D649 Anemia, unspecified: Secondary | ICD-10-CM | POA: Insufficient documentation

## 2022-11-19 DIAGNOSIS — D509 Iron deficiency anemia, unspecified: Secondary | ICD-10-CM | POA: Insufficient documentation

## 2022-11-19 MED ORDER — DIPHENHYDRAMINE HCL 25 MG PO CAPS
50.0000 mg | ORAL_CAPSULE | ORAL | Status: DC
  Administered 2022-11-19: 25 mg via ORAL

## 2022-11-19 MED ORDER — SODIUM CHLORIDE 0.9 % IV SOLN
300.0000 mg | INTRAVENOUS | Status: DC
  Administered 2022-11-19: 300 mg via INTRAVENOUS
  Filled 2022-11-19: qty 300

## 2022-11-19 MED ORDER — ACETAMINOPHEN 325 MG PO TABS
ORAL_TABLET | ORAL | Status: AC
  Filled 2022-11-19: qty 2

## 2022-11-19 MED ORDER — DIPHENHYDRAMINE HCL 25 MG PO CAPS
ORAL_CAPSULE | ORAL | Status: AC
  Filled 2022-11-19: qty 1

## 2022-11-19 MED ORDER — ACETAMINOPHEN 325 MG PO TABS
650.0000 mg | ORAL_TABLET | ORAL | Status: DC
  Administered 2022-11-19: 650 mg via ORAL

## 2022-11-21 ENCOUNTER — Encounter (HOSPITAL_COMMUNITY): Payer: Self-pay

## 2022-11-27 ENCOUNTER — Encounter (HOSPITAL_COMMUNITY)
Admission: RE | Admit: 2022-11-27 | Discharge: 2022-11-27 | Disposition: A | Discharge status: Home / Self Care | Payer: No Typology Code available for payment source | Source: Ambulatory Visit | Attending: Family Medicine | Admitting: Family Medicine

## 2022-11-27 DIAGNOSIS — D509 Iron deficiency anemia, unspecified: Secondary | ICD-10-CM | POA: Diagnosis present

## 2022-11-27 DIAGNOSIS — D649 Anemia, unspecified: Secondary | ICD-10-CM | POA: Diagnosis present

## 2022-11-27 MED ORDER — ACETAMINOPHEN 325 MG PO TABS
650.0000 mg | ORAL_TABLET | ORAL | Status: DC
  Administered 2022-11-27: 650 mg via ORAL

## 2022-11-27 MED ORDER — SODIUM CHLORIDE 0.9 % IV SOLN
300.0000 mg | INTRAVENOUS | Status: DC
  Administered 2022-11-27: 300 mg via INTRAVENOUS
  Filled 2022-11-27: qty 300

## 2022-11-27 MED ORDER — DIPHENHYDRAMINE HCL 25 MG PO CAPS
50.0000 mg | ORAL_CAPSULE | ORAL | Status: DC
  Administered 2022-11-27: 25 mg via ORAL

## 2022-11-27 MED ORDER — DIPHENHYDRAMINE HCL 25 MG PO CAPS
ORAL_CAPSULE | ORAL | Status: AC
  Filled 2022-11-27: qty 1

## 2022-11-27 MED ORDER — ACETAMINOPHEN 325 MG PO TABS
ORAL_TABLET | ORAL | Status: AC
  Filled 2022-11-27: qty 2

## 2022-12-04 ENCOUNTER — Ambulatory Visit (HOSPITAL_COMMUNITY)
Admission: RE | Admit: 2022-12-04 | Discharge: 2022-12-04 | Disposition: A | Discharge status: Home / Self Care | Payer: No Typology Code available for payment source | Source: Ambulatory Visit | Attending: Family Medicine | Admitting: Family Medicine

## 2022-12-04 DIAGNOSIS — D509 Iron deficiency anemia, unspecified: Secondary | ICD-10-CM | POA: Insufficient documentation

## 2022-12-04 MED ORDER — ACETAMINOPHEN 325 MG PO TABS
ORAL_TABLET | ORAL | Status: AC
  Administered 2022-12-04: 650 mg via ORAL
  Filled 2022-12-04: qty 2

## 2022-12-04 MED ORDER — DIPHENHYDRAMINE HCL 25 MG PO CAPS: 50.0000 mg | ORAL_CAPSULE | ORAL | Status: DC

## 2022-12-04 MED ORDER — SODIUM CHLORIDE 0.9 % IV SOLN
300.0000 mg | INTRAVENOUS | Status: DC
  Administered 2022-12-04: 300 mg via INTRAVENOUS
  Filled 2022-12-04: qty 300

## 2022-12-04 MED ORDER — ACETAMINOPHEN 325 MG PO TABS: 650.0000 mg | ORAL_TABLET | ORAL | Status: DC

## 2022-12-11 ENCOUNTER — Other Ambulatory Visit (HOSPITAL_BASED_OUTPATIENT_CLINIC_OR_DEPARTMENT_OTHER): Payer: Self-pay

## 2022-12-15 ENCOUNTER — Other Ambulatory Visit (HOSPITAL_BASED_OUTPATIENT_CLINIC_OR_DEPARTMENT_OTHER): Payer: Self-pay

## 2022-12-18 ENCOUNTER — Other Ambulatory Visit (HOSPITAL_BASED_OUTPATIENT_CLINIC_OR_DEPARTMENT_OTHER): Payer: Self-pay

## 2022-12-18 MED ORDER — CLONAZEPAM 0.5 MG PO TABS
0.5000 mg | ORAL_TABLET | Freq: Every day | ORAL | 0 refills | Status: AC
  Filled 2022-12-18: qty 30, 30d supply, fill #0

## 2022-12-18 MED ORDER — CELECOXIB 200 MG PO CAPS
200.0000 mg | ORAL_CAPSULE | Freq: Every day | ORAL | 0 refills | Status: AC
  Filled 2022-12-18: qty 30, 30d supply, fill #0

## 2022-12-21 ENCOUNTER — Other Ambulatory Visit (HOSPITAL_BASED_OUTPATIENT_CLINIC_OR_DEPARTMENT_OTHER): Payer: Self-pay

## 2023-02-09 ENCOUNTER — Other Ambulatory Visit (HOSPITAL_BASED_OUTPATIENT_CLINIC_OR_DEPARTMENT_OTHER): Payer: Self-pay

## 2023-02-10 ENCOUNTER — Other Ambulatory Visit (HOSPITAL_BASED_OUTPATIENT_CLINIC_OR_DEPARTMENT_OTHER): Payer: Self-pay

## 2023-02-22 ENCOUNTER — Other Ambulatory Visit (HOSPITAL_BASED_OUTPATIENT_CLINIC_OR_DEPARTMENT_OTHER): Payer: Self-pay

## 2023-02-25 ENCOUNTER — Other Ambulatory Visit (HOSPITAL_BASED_OUTPATIENT_CLINIC_OR_DEPARTMENT_OTHER): Payer: Self-pay

## 2023-02-26 ENCOUNTER — Other Ambulatory Visit: Payer: Self-pay

## 2023-02-26 ENCOUNTER — Other Ambulatory Visit (HOSPITAL_BASED_OUTPATIENT_CLINIC_OR_DEPARTMENT_OTHER): Payer: Self-pay

## 2023-03-09 ENCOUNTER — Other Ambulatory Visit: Payer: Self-pay | Admitting: Family Medicine

## 2023-03-09 ENCOUNTER — Other Ambulatory Visit (HOSPITAL_BASED_OUTPATIENT_CLINIC_OR_DEPARTMENT_OTHER): Payer: Self-pay

## 2023-03-09 DIAGNOSIS — Z1231 Encounter for screening mammogram for malignant neoplasm of breast: Secondary | ICD-10-CM

## 2023-03-10 ENCOUNTER — Other Ambulatory Visit (HOSPITAL_BASED_OUTPATIENT_CLINIC_OR_DEPARTMENT_OTHER): Payer: Self-pay

## 2023-03-24 ENCOUNTER — Ambulatory Visit: Payer: No Typology Code available for payment source

## 2023-03-25 ENCOUNTER — Other Ambulatory Visit (HOSPITAL_BASED_OUTPATIENT_CLINIC_OR_DEPARTMENT_OTHER): Payer: Self-pay

## 2023-03-29 ENCOUNTER — Other Ambulatory Visit (HOSPITAL_BASED_OUTPATIENT_CLINIC_OR_DEPARTMENT_OTHER): Payer: Self-pay

## 2023-04-06 ENCOUNTER — Ambulatory Visit
Admission: RE | Admit: 2023-04-06 | Discharge: 2023-04-06 | Disposition: A | Discharge status: Home / Self Care | Payer: Self-pay | Source: Ambulatory Visit | Attending: Family Medicine | Admitting: Family Medicine

## 2023-04-06 DIAGNOSIS — Z1231 Encounter for screening mammogram for malignant neoplasm of breast: Secondary | ICD-10-CM

## 2023-04-07 ENCOUNTER — Other Ambulatory Visit (HOSPITAL_BASED_OUTPATIENT_CLINIC_OR_DEPARTMENT_OTHER): Payer: Self-pay

## 2023-04-07 MED ORDER — LEVOTHYROXINE SODIUM 25 MCG PO TABS: ORAL_TABLET | ORAL | 0 refills | Status: AC

## 2023-04-07 MED ORDER — CLONAZEPAM 0.5 MG PO TABS: 0.2500 mg | ORAL_TABLET | Freq: Every evening | ORAL | 0 refills | Status: AC | PRN

## 2023-04-12 ENCOUNTER — Other Ambulatory Visit: Payer: Self-pay | Admitting: Family Medicine

## 2023-04-12 DIAGNOSIS — R928 Other abnormal and inconclusive findings on diagnostic imaging of breast: Secondary | ICD-10-CM

## 2023-04-24 ENCOUNTER — Ambulatory Visit
Admission: RE | Admit: 2023-04-24 | Discharge: 2023-04-24 | Disposition: A | Discharge status: Home / Self Care | Payer: No Typology Code available for payment source | Source: Ambulatory Visit | Attending: Family Medicine | Admitting: Family Medicine

## 2023-04-24 DIAGNOSIS — R928 Other abnormal and inconclusive findings on diagnostic imaging of breast: Secondary | ICD-10-CM

## 2023-05-14 ENCOUNTER — Other Ambulatory Visit (HOSPITAL_BASED_OUTPATIENT_CLINIC_OR_DEPARTMENT_OTHER): Payer: Self-pay

## 2023-05-14 MED ORDER — RIZATRIPTAN BENZOATE 10 MG PO TABS
10.0000 mg | ORAL_TABLET | Freq: Every day | ORAL | 3 refills | Status: DC | PRN
Start: 2023-05-14 — End: 2023-11-25
  Filled 2023-05-14 – 2023-05-24 (×2): qty 6, 6d supply, fill #0
  Filled 2023-07-16: qty 6, 6d supply, fill #1
  Filled 2023-08-16 (×2): qty 6, 6d supply, fill #2
  Filled 2023-09-14: qty 6, 6d supply, fill #3

## 2023-05-24 ENCOUNTER — Other Ambulatory Visit (HOSPITAL_BASED_OUTPATIENT_CLINIC_OR_DEPARTMENT_OTHER): Payer: Self-pay

## 2023-07-16 ENCOUNTER — Other Ambulatory Visit (HOSPITAL_BASED_OUTPATIENT_CLINIC_OR_DEPARTMENT_OTHER): Payer: Self-pay

## 2023-07-16 MED ORDER — OMEPRAZOLE 40 MG PO CPDR
40.0000 mg | DELAYED_RELEASE_CAPSULE | Freq: Every day | ORAL | 0 refills | Status: DC
  Filled 2023-07-16: qty 30, 30d supply, fill #0

## 2023-07-20 ENCOUNTER — Other Ambulatory Visit: Payer: Self-pay | Admitting: Family Medicine

## 2023-07-20 DIAGNOSIS — R1011 Right upper quadrant pain: Secondary | ICD-10-CM

## 2023-07-22 ENCOUNTER — Other Ambulatory Visit (HOSPITAL_BASED_OUTPATIENT_CLINIC_OR_DEPARTMENT_OTHER): Payer: Self-pay

## 2023-07-22 MED ORDER — NORELGESTROMIN-ETH ESTRADIOL 150-35 MCG/24HR TD PTWK
1.0000 | MEDICATED_PATCH | TRANSDERMAL | 3 refills | Status: AC
  Filled 2023-07-22: qty 12, 84d supply, fill #0

## 2023-07-22 MED ORDER — CHOLECALCIFEROL 1.25 MG (50000 UT) PO CAPS
50000.0000 [IU] | ORAL_CAPSULE | ORAL | 0 refills | Status: AC
  Filled 2023-07-22: qty 8, 56d supply, fill #0

## 2023-07-22 MED ORDER — TRANEXAMIC ACID 650 MG PO TABS
1300.0000 mg | ORAL_TABLET | Freq: Three times a day (TID) | ORAL | 0 refills | Status: AC
  Filled 2023-07-22: qty 30, 5d supply, fill #0

## 2023-07-30 ENCOUNTER — Other Ambulatory Visit

## 2023-08-02 ENCOUNTER — Other Ambulatory Visit

## 2023-08-03 ENCOUNTER — Ambulatory Visit
Admission: RE | Admit: 2023-08-03 | Discharge: 2023-08-03 | Disposition: A | Discharge status: Home / Self Care | Source: Ambulatory Visit | Attending: Family Medicine | Admitting: Family Medicine

## 2023-08-03 DIAGNOSIS — R1011 Right upper quadrant pain: Secondary | ICD-10-CM

## 2023-08-16 ENCOUNTER — Other Ambulatory Visit (HOSPITAL_BASED_OUTPATIENT_CLINIC_OR_DEPARTMENT_OTHER): Payer: Self-pay

## 2023-08-16 ENCOUNTER — Telehealth: Payer: Self-pay | Admitting: Neurology

## 2023-08-16 MED ORDER — OMEPRAZOLE 40 MG PO CPDR
40.0000 mg | DELAYED_RELEASE_CAPSULE | Freq: Every day | ORAL | 0 refills | Status: AC
  Filled 2023-08-16: qty 90, 90d supply, fill #0

## 2023-08-16 NOTE — Telephone Encounter (Signed)
 John(pharmacist) is asking for a call to discuss alternative to Ajovy , insurance request it be Aimovig, Qulipta or Emgality 

## 2023-08-17 NOTE — Telephone Encounter (Signed)
 Patient previously seen for Botox  04/2022 but unable to resume to due bad debt balance, I had only saw her that one time for botox . She is a patient of Dr. Tresia Fruit. She will need to schedule a f/u visit with Dr. Tresia Fruit to discuss further treatment options.

## 2023-08-18 ENCOUNTER — Other Ambulatory Visit (HOSPITAL_BASED_OUTPATIENT_CLINIC_OR_DEPARTMENT_OTHER): Payer: Self-pay

## 2023-08-18 NOTE — Telephone Encounter (Signed)
 I called and LMVM for pt to return call.  She had debt balance, and needs to speak to billing.  We are happy to see her once she speaks to them and make appt for medication management.  Can see NP Camilo Cella or Dr. Tresia Fruit is appt available.

## 2023-08-20 ENCOUNTER — Other Ambulatory Visit (HOSPITAL_BASED_OUTPATIENT_CLINIC_OR_DEPARTMENT_OTHER): Payer: Self-pay

## 2023-08-20 MED ORDER — CLONAZEPAM 0.5 MG PO TABS
0.2500 mg | ORAL_TABLET | Freq: Every day | ORAL | 0 refills | Status: AC
  Filled 2023-08-20 – 2023-09-14 (×2): qty 30, 30d supply, fill #0

## 2023-08-25 ENCOUNTER — Other Ambulatory Visit (HOSPITAL_BASED_OUTPATIENT_CLINIC_OR_DEPARTMENT_OTHER): Payer: Self-pay

## 2023-09-07 ENCOUNTER — Other Ambulatory Visit (HOSPITAL_BASED_OUTPATIENT_CLINIC_OR_DEPARTMENT_OTHER): Payer: Self-pay

## 2023-09-07 MED ORDER — MINOXIDIL FOR MEN 5 % EX FOAM
0.5000 | Freq: Every day | CUTANEOUS | 2 refills | Status: AC
  Filled 2023-09-07: qty 60, 30d supply, fill #0
  Filled 2023-11-24: qty 60, 30d supply, fill #1
  Filled 2024-01-31: qty 60, 30d supply, fill #2

## 2023-09-08 ENCOUNTER — Other Ambulatory Visit (HOSPITAL_BASED_OUTPATIENT_CLINIC_OR_DEPARTMENT_OTHER): Payer: Self-pay

## 2023-09-14 ENCOUNTER — Other Ambulatory Visit (HOSPITAL_BASED_OUTPATIENT_CLINIC_OR_DEPARTMENT_OTHER): Payer: Self-pay

## 2023-11-04 ENCOUNTER — Other Ambulatory Visit (HOSPITAL_BASED_OUTPATIENT_CLINIC_OR_DEPARTMENT_OTHER): Payer: Self-pay

## 2023-11-04 MED ORDER — LEVOTHYROXINE SODIUM 25 MCG PO TABS
25.0000 ug | ORAL_TABLET | Freq: Every day | ORAL | 5 refills | Status: AC
  Filled 2023-11-04: qty 60, 45d supply, fill #0
  Filled 2024-03-07: qty 30, 30d supply, fill #0
  Filled 2024-04-06: qty 30, 30d supply, fill #1

## 2023-11-24 ENCOUNTER — Other Ambulatory Visit (HOSPITAL_BASED_OUTPATIENT_CLINIC_OR_DEPARTMENT_OTHER): Payer: Self-pay

## 2023-11-25 ENCOUNTER — Other Ambulatory Visit (HOSPITAL_BASED_OUTPATIENT_CLINIC_OR_DEPARTMENT_OTHER): Payer: Self-pay

## 2023-11-25 MED ORDER — CLOBETASOL PROPIONATE 0.05 % EX CREA
1.0000 | TOPICAL_CREAM | Freq: Two times a day (BID) | CUTANEOUS | 1 refills | Status: AC | PRN
  Filled 2023-11-25: qty 60, 30d supply, fill #0

## 2023-11-25 MED ORDER — RIZATRIPTAN BENZOATE 10 MG PO TABS
10.0000 mg | ORAL_TABLET | Freq: Every day | ORAL | 3 refills | Status: AC | PRN
  Filled 2023-11-25: qty 6, 6d supply, fill #0
  Filled 2024-01-05: qty 6, 6d supply, fill #1
  Filled 2024-01-31: qty 6, 6d supply, fill #2
  Filled 2024-02-08 – 2024-02-21 (×2): qty 6, 6d supply, fill #3

## 2023-11-29 ENCOUNTER — Other Ambulatory Visit (HOSPITAL_BASED_OUTPATIENT_CLINIC_OR_DEPARTMENT_OTHER): Payer: Self-pay

## 2023-12-01 ENCOUNTER — Other Ambulatory Visit (HOSPITAL_BASED_OUTPATIENT_CLINIC_OR_DEPARTMENT_OTHER): Payer: Self-pay

## 2023-12-02 ENCOUNTER — Other Ambulatory Visit (HOSPITAL_BASED_OUTPATIENT_CLINIC_OR_DEPARTMENT_OTHER): Payer: Self-pay

## 2023-12-02 MED ORDER — CLONAZEPAM 0.5 MG PO TABS
0.2500 mg | ORAL_TABLET | Freq: Every evening | ORAL | 0 refills | Status: AC | PRN
  Filled 2023-12-02: qty 30, 30d supply, fill #0

## 2024-01-05 ENCOUNTER — Other Ambulatory Visit (HOSPITAL_BASED_OUTPATIENT_CLINIC_OR_DEPARTMENT_OTHER): Payer: Self-pay

## 2024-01-25 ENCOUNTER — Other Ambulatory Visit (HOSPITAL_BASED_OUTPATIENT_CLINIC_OR_DEPARTMENT_OTHER): Payer: Self-pay

## 2024-01-31 ENCOUNTER — Other Ambulatory Visit (HOSPITAL_BASED_OUTPATIENT_CLINIC_OR_DEPARTMENT_OTHER): Payer: Self-pay

## 2024-01-31 ENCOUNTER — Other Ambulatory Visit: Payer: Self-pay

## 2024-02-02 ENCOUNTER — Other Ambulatory Visit (HOSPITAL_BASED_OUTPATIENT_CLINIC_OR_DEPARTMENT_OTHER): Payer: Self-pay

## 2024-02-04 ENCOUNTER — Other Ambulatory Visit (HOSPITAL_BASED_OUTPATIENT_CLINIC_OR_DEPARTMENT_OTHER): Payer: Self-pay

## 2024-02-08 ENCOUNTER — Other Ambulatory Visit: Payer: Self-pay | Admitting: Neurology

## 2024-02-08 ENCOUNTER — Other Ambulatory Visit: Payer: Self-pay

## 2024-02-08 ENCOUNTER — Other Ambulatory Visit (HOSPITAL_BASED_OUTPATIENT_CLINIC_OR_DEPARTMENT_OTHER): Payer: Self-pay

## 2024-02-14 ENCOUNTER — Telehealth: Payer: Self-pay | Admitting: *Deleted

## 2024-02-14 NOTE — Telephone Encounter (Signed)
 LVM asking for pt to call back to set up appointment with a new neurologist  If pt calls back you can schedule an appt with Dr Onita

## 2024-02-15 ENCOUNTER — Encounter (HOSPITAL_BASED_OUTPATIENT_CLINIC_OR_DEPARTMENT_OTHER): Payer: Self-pay

## 2024-02-15 ENCOUNTER — Other Ambulatory Visit (HOSPITAL_BASED_OUTPATIENT_CLINIC_OR_DEPARTMENT_OTHER): Payer: Self-pay

## 2024-02-18 ENCOUNTER — Other Ambulatory Visit (HOSPITAL_BASED_OUTPATIENT_CLINIC_OR_DEPARTMENT_OTHER): Payer: Self-pay

## 2024-02-21 ENCOUNTER — Other Ambulatory Visit (HOSPITAL_BASED_OUTPATIENT_CLINIC_OR_DEPARTMENT_OTHER): Payer: Self-pay

## 2024-03-07 ENCOUNTER — Other Ambulatory Visit (HOSPITAL_BASED_OUTPATIENT_CLINIC_OR_DEPARTMENT_OTHER): Payer: Self-pay

## 2024-03-07 ENCOUNTER — Other Ambulatory Visit: Payer: Self-pay

## 2024-03-08 ENCOUNTER — Other Ambulatory Visit (HOSPITAL_BASED_OUTPATIENT_CLINIC_OR_DEPARTMENT_OTHER): Payer: Self-pay

## 2024-03-08 MED ORDER — DOXYCYCLINE MONOHYDRATE 100 MG PO CAPS
100.0000 mg | ORAL_CAPSULE | Freq: Two times a day (BID) | ORAL | 0 refills | Status: AC
  Filled 2024-03-08: qty 14, 7d supply, fill #0

## 2024-03-27 ENCOUNTER — Encounter (HOSPITAL_BASED_OUTPATIENT_CLINIC_OR_DEPARTMENT_OTHER): Payer: Self-pay

## 2024-03-27 ENCOUNTER — Other Ambulatory Visit (HOSPITAL_BASED_OUTPATIENT_CLINIC_OR_DEPARTMENT_OTHER): Payer: Self-pay

## 2024-04-06 ENCOUNTER — Other Ambulatory Visit (HOSPITAL_COMMUNITY): Payer: Self-pay

## 2024-04-06 ENCOUNTER — Other Ambulatory Visit (HOSPITAL_BASED_OUTPATIENT_CLINIC_OR_DEPARTMENT_OTHER): Payer: Self-pay
